# Patient Record
Sex: Male | Born: 1935 | Race: White | Hispanic: No | Marital: Married | Smoking: Former smoker
Health system: Southern US, Community
[De-identification: ages and names within clinical notes are randomized; demographics above are authoritative.]

## PROBLEM LIST (undated history)

## (undated) DIAGNOSIS — I10 Essential (primary) hypertension: Secondary | ICD-10-CM

## (undated) DIAGNOSIS — G709 Myoneural disorder, unspecified: Secondary | ICD-10-CM

## (undated) DIAGNOSIS — E7800 Pure hypercholesterolemia, unspecified: Secondary | ICD-10-CM

## (undated) DIAGNOSIS — H9190 Unspecified hearing loss, unspecified ear: Secondary | ICD-10-CM

## (undated) DIAGNOSIS — E119 Type 2 diabetes mellitus without complications: Secondary | ICD-10-CM

## (undated) DIAGNOSIS — C801 Malignant (primary) neoplasm, unspecified: Secondary | ICD-10-CM

## (undated) DIAGNOSIS — J189 Pneumonia, unspecified organism: Secondary | ICD-10-CM

## (undated) DIAGNOSIS — E860 Dehydration: Secondary | ICD-10-CM

## (undated) DIAGNOSIS — M1990 Unspecified osteoarthritis, unspecified site: Secondary | ICD-10-CM

## (undated) HISTORY — PX: COLONOSCOPY: SHX174

## (undated) HISTORY — DX: Essential (primary) hypertension: I10

## (undated) HISTORY — DX: Type 2 diabetes mellitus without complications: E11.9

---

## 1996-08-24 DIAGNOSIS — G250 Essential tremor: Secondary | ICD-10-CM | POA: Insufficient documentation

## 2000-04-22 DIAGNOSIS — K5730 Diverticulosis of large intestine without perforation or abscess without bleeding: Secondary | ICD-10-CM | POA: Insufficient documentation

## 2007-05-04 DIAGNOSIS — D126 Benign neoplasm of colon, unspecified: Secondary | ICD-10-CM | POA: Insufficient documentation

## 2008-02-18 DIAGNOSIS — M8580 Other specified disorders of bone density and structure, unspecified site: Secondary | ICD-10-CM | POA: Insufficient documentation

## 2009-10-17 DIAGNOSIS — K760 Fatty (change of) liver, not elsewhere classified: Secondary | ICD-10-CM | POA: Insufficient documentation

## 2010-10-23 DIAGNOSIS — E119 Type 2 diabetes mellitus without complications: Secondary | ICD-10-CM | POA: Insufficient documentation

## 2011-01-24 DIAGNOSIS — L570 Actinic keratosis: Secondary | ICD-10-CM | POA: Insufficient documentation

## 2011-07-19 DIAGNOSIS — H40059 Ocular hypertension, unspecified eye: Secondary | ICD-10-CM | POA: Insufficient documentation

## 2011-07-19 DIAGNOSIS — H25049 Posterior subcapsular polar age-related cataract, unspecified eye: Secondary | ICD-10-CM | POA: Insufficient documentation

## 2011-07-19 DIAGNOSIS — H43819 Vitreous degeneration, unspecified eye: Secondary | ICD-10-CM | POA: Insufficient documentation

## 2012-09-12 ENCOUNTER — Emergency Department: Payer: Self-pay | Admitting: Emergency Medicine

## 2012-09-13 ENCOUNTER — Other Ambulatory Visit: Payer: Self-pay

## 2012-09-13 LAB — SEDIMENTATION RATE: Erythrocyte Sed Rate: 1 mm/hr (ref 0–20)

## 2012-09-14 ENCOUNTER — Other Ambulatory Visit: Payer: Self-pay | Admitting: Ophthalmology

## 2012-09-15 ENCOUNTER — Ambulatory Visit: Payer: Self-pay | Admitting: Ophthalmology

## 2013-01-26 DIAGNOSIS — Z85828 Personal history of other malignant neoplasm of skin: Secondary | ICD-10-CM | POA: Insufficient documentation

## 2013-03-03 DIAGNOSIS — C44310 Basal cell carcinoma of skin of unspecified parts of face: Secondary | ICD-10-CM | POA: Insufficient documentation

## 2013-04-05 DIAGNOSIS — D751 Secondary polycythemia: Secondary | ICD-10-CM | POA: Insufficient documentation

## 2013-09-29 ENCOUNTER — Encounter: Payer: Self-pay | Admitting: Podiatry

## 2013-09-29 ENCOUNTER — Ambulatory Visit (INDEPENDENT_AMBULATORY_CARE_PROVIDER_SITE_OTHER): Payer: Medicare Other | Admitting: Podiatry

## 2013-09-29 VITALS — BP 118/62 | HR 79 | Resp 14 | Ht 72.0 in | Wt 206.0 lb

## 2013-09-29 DIAGNOSIS — M79609 Pain in unspecified limb: Secondary | ICD-10-CM

## 2013-09-29 DIAGNOSIS — B351 Tinea unguium: Secondary | ICD-10-CM

## 2013-09-29 NOTE — Progress Notes (Signed)
Magnum presents today with a chief complaint of painful toenails bilateral.  Objective: Vital signs are stable he is alert and oriented x3. Pulses are palpable bilateral. Nails are thick yellow dystrophic clinically mycotic and painful he ingrown.  Assessment: Pain in limb secondary to onychomycosis and ingrown nails.  Plan: Debridement of nails in thickness and length as a covered service secondary to pain. We'll followup with him in 3 months.

## 2013-12-01 ENCOUNTER — Inpatient Hospital Stay: Payer: Self-pay | Admitting: Internal Medicine

## 2013-12-01 LAB — URINALYSIS, COMPLETE
Bacteria: NONE SEEN
Bilirubin,UR: NEGATIVE
Blood: NEGATIVE
Glucose,UR: 150 mg/dL (ref 0–75)
Hyaline Cast: 4
Leukocyte Esterase: NEGATIVE
Nitrite: NEGATIVE
Ph: 5 (ref 4.5–8.0)
Protein: 30
RBC,UR: 1 /HPF (ref 0–5)
Specific Gravity: 1.019 (ref 1.003–1.030)
Squamous Epithelial: NONE SEEN
Transitional Epi: 1
WBC UR: 2 /HPF (ref 0–5)

## 2013-12-01 LAB — CBC
HCT: 47.8 % (ref 40.0–52.0)
HGB: 16.3 g/dL (ref 13.0–18.0)
MCH: 31.1 pg (ref 26.0–34.0)
MCHC: 34.1 g/dL (ref 32.0–36.0)
MCV: 91 fL (ref 80–100)
Platelet: 156 10*3/uL (ref 150–440)
RBC: 5.24 10*6/uL (ref 4.40–5.90)
RDW: 13.7 % (ref 11.5–14.5)
WBC: 5.8 10*3/uL (ref 3.8–10.6)

## 2013-12-01 LAB — COMPREHENSIVE METABOLIC PANEL
Albumin: 3.5 g/dL (ref 3.4–5.0)
Alkaline Phosphatase: 66 U/L
Anion Gap: 11 (ref 7–16)
BUN: 18 mg/dL (ref 7–18)
Bilirubin,Total: 1 mg/dL (ref 0.2–1.0)
Calcium, Total: 8.4 mg/dL — ABNORMAL LOW (ref 8.5–10.1)
Chloride: 98 mmol/L (ref 98–107)
Co2: 15 mmol/L — ABNORMAL LOW (ref 21–32)
Creatinine: 0.92 mg/dL (ref 0.60–1.30)
EGFR (African American): >60
EGFR (Non-African Amer.): >60
Glucose: 216 mg/dL — ABNORMAL HIGH (ref 65–99)
Osmolality: 258 (ref 275–301)
Potassium: 4.9 mmol/L (ref 3.5–5.1)
SGOT(AST): 163 U/L — ABNORMAL HIGH (ref 15–37)
SGPT (ALT): 136 U/L — ABNORMAL HIGH (ref 12–78)
Sodium: 124 mmol/L — ABNORMAL LOW (ref 136–145)
Total Protein: 8.2 g/dL (ref 6.4–8.2)

## 2013-12-01 LAB — PRO B NATRIURETIC PEPTIDE: B-Type Natriuretic Peptide: 182 pg/mL (ref 0–450)

## 2013-12-01 LAB — RAPID INFLUENZA A&B ANTIGENS

## 2013-12-01 LAB — TROPONIN I: Troponin-I: <0.02 ng/mL

## 2013-12-02 LAB — COMPREHENSIVE METABOLIC PANEL
Albumin: 2.9 g/dL — ABNORMAL LOW (ref 3.4–5.0)
Alkaline Phosphatase: 54 U/L
Anion Gap: 6 — ABNORMAL LOW (ref 7–16)
BUN: 13 mg/dL (ref 7–18)
Bilirubin,Total: 0.5 mg/dL (ref 0.2–1.0)
Calcium, Total: 7.9 mg/dL — ABNORMAL LOW (ref 8.5–10.1)
Chloride: 104 mmol/L (ref 98–107)
Co2: 24 mmol/L (ref 21–32)
Creatinine: 1.13 mg/dL (ref 0.60–1.30)
EGFR (African American): >60
EGFR (Non-African Amer.): >60
Glucose: 154 mg/dL — ABNORMAL HIGH (ref 65–99)
Osmolality: 271 (ref 275–301)
Potassium: 3.5 mmol/L (ref 3.5–5.1)
SGOT(AST): 124 U/L — ABNORMAL HIGH (ref 15–37)
SGPT (ALT): 112 U/L — ABNORMAL HIGH (ref 12–78)
Sodium: 134 mmol/L — ABNORMAL LOW (ref 136–145)
Total Protein: 6.9 g/dL (ref 6.4–8.2)

## 2013-12-02 LAB — CBC WITH DIFFERENTIAL/PLATELET
Basophil #: 0 10*3/uL (ref 0.0–0.1)
Basophil %: 1.1 %
Eosinophil #: 0 10*3/uL (ref 0.0–0.7)
Eosinophil %: 0.9 %
HCT: 42 % (ref 40.0–52.0)
HGB: 14.5 g/dL (ref 13.0–18.0)
Lymphocyte #: 1.7 10*3/uL (ref 1.0–3.6)
Lymphocyte %: 38.2 %
MCH: 31.2 pg (ref 26.0–34.0)
MCHC: 34.5 g/dL (ref 32.0–36.0)
MCV: 91 fL (ref 80–100)
Monocyte #: 0.8 x10 3/mm (ref 0.2–1.0)
Monocyte %: 17.6 %
Neutrophil #: 1.9 10*3/uL (ref 1.4–6.5)
Neutrophil %: 42.2 %
Platelet: 166 10*3/uL (ref 150–440)
RBC: 4.64 10*6/uL (ref 4.40–5.90)
RDW: 13.8 % (ref 11.5–14.5)
WBC: 4.5 10*3/uL (ref 3.8–10.6)

## 2013-12-02 LAB — CLOSTRIDIUM DIFFICILE(ARMC)

## 2013-12-05 LAB — STOOL CULTURE

## 2013-12-06 LAB — CULTURE, BLOOD (SINGLE)

## 2013-12-29 ENCOUNTER — Ambulatory Visit (INDEPENDENT_AMBULATORY_CARE_PROVIDER_SITE_OTHER): Payer: Medicare Other | Admitting: Podiatry

## 2013-12-29 ENCOUNTER — Encounter: Payer: Self-pay | Admitting: Podiatry

## 2013-12-29 VITALS — BP 99/51 | HR 79 | Resp 16 | Ht 71.0 in | Wt 204.0 lb

## 2013-12-29 DIAGNOSIS — B351 Tinea unguium: Secondary | ICD-10-CM

## 2013-12-29 DIAGNOSIS — M79609 Pain in unspecified limb: Secondary | ICD-10-CM

## 2013-12-29 NOTE — Progress Notes (Signed)
Mathew Wright presents today with a chief complaint of painful toenails one through 5 bilateral.  Objective: Vital signs are stable he is alert and oriented x3 pulses remain palpable bilateral. No open lesions. Nails are thick yellow dystrophic onychomycotic and painful palpation.  Assessment: Pain in limb secondary to onychomycosis 1 through 5 bilateral.  Plan: Debridement of nails 1 through 5 bilateral is cover service secondary to pain.

## 2014-02-04 IMAGING — US US CAROTID DUPLEX BILAT
1 series · 14 of 24 positions shown · non-contrast
Comparison: none

REASON FOR EXAM: amaurosis fugax
COMMENTS:

PROCEDURE:     PERI - PERI CAROTID DOPPLER BILATERAL  - September 15, 2012  [DATE]
RESULT:

[Series 1: us carotid duplex bilat · 0.07mm/px · 14 of 58 slices shown]
[im 1/58]
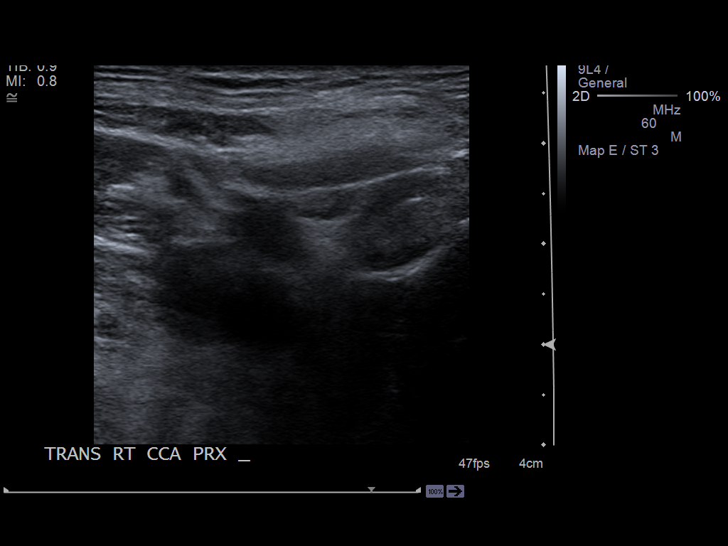
[im 5/58]
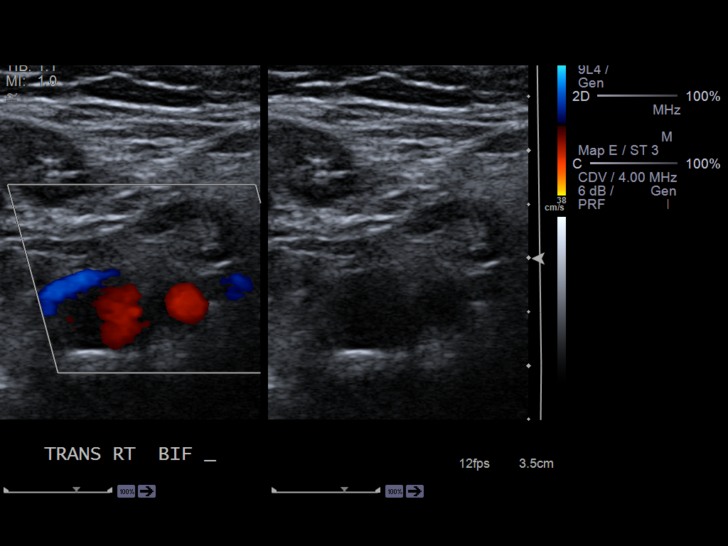
[im 10/58]
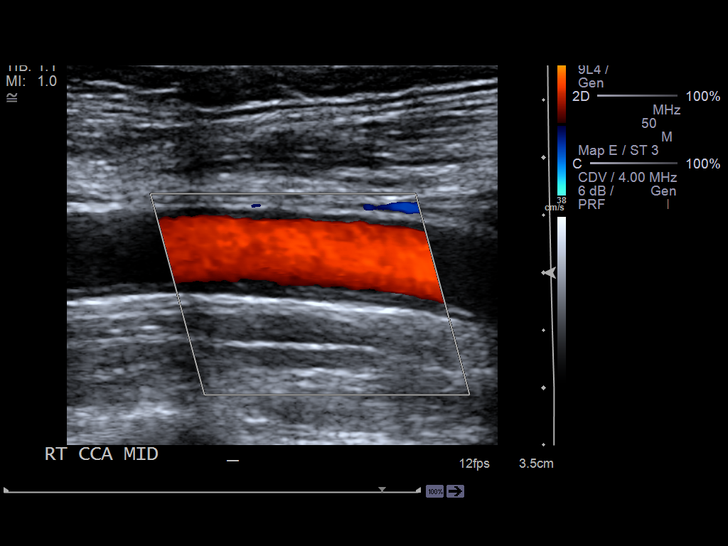
[im 15/58]
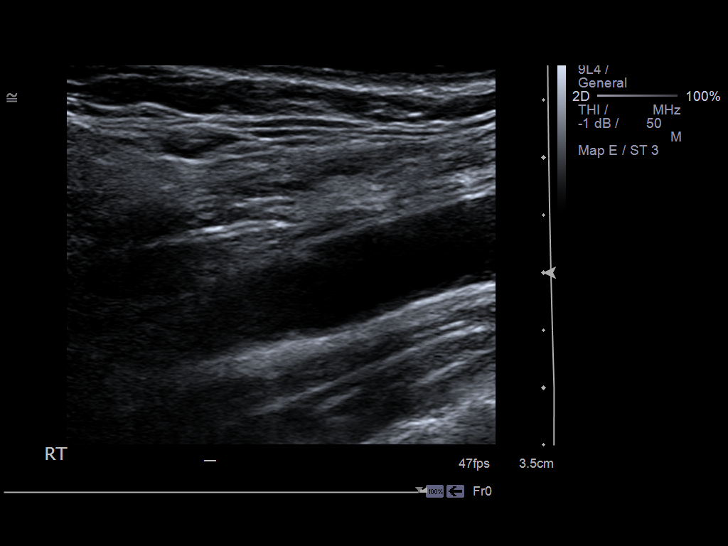
[im 18/58]
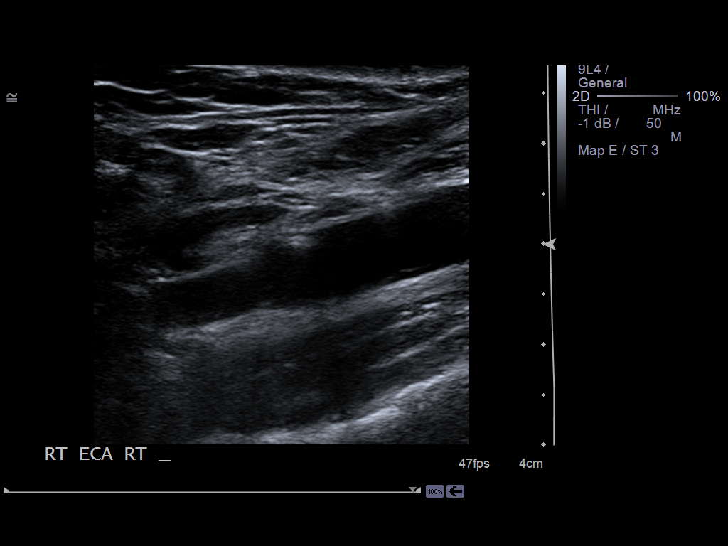
[im 23/58]
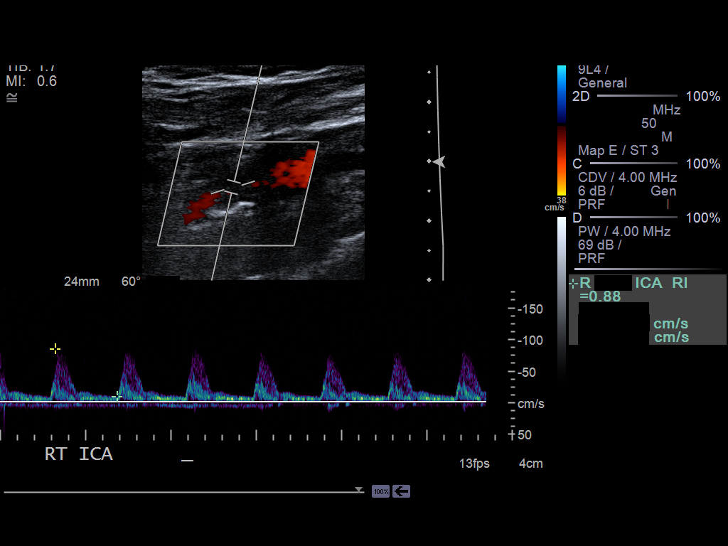
[im 28/58]
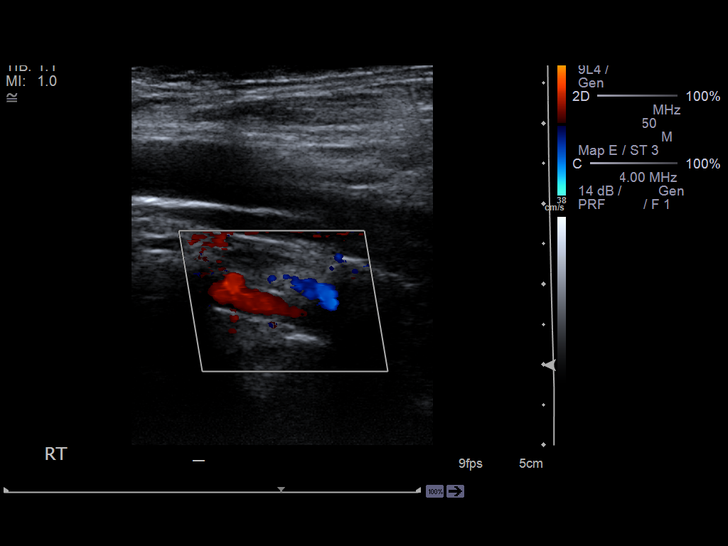
[im 30/58]
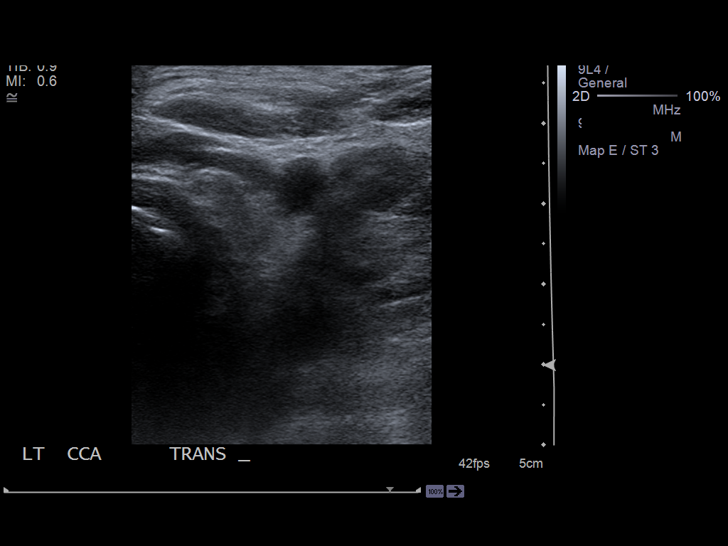
[im 35/58]
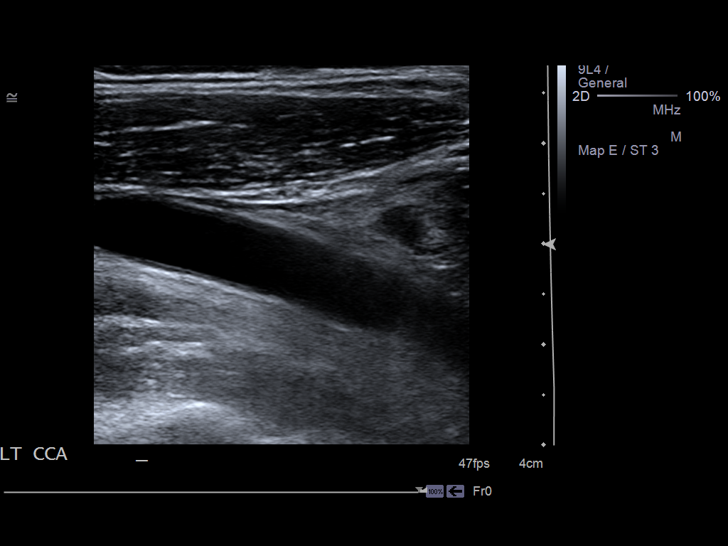
[im 40/58]
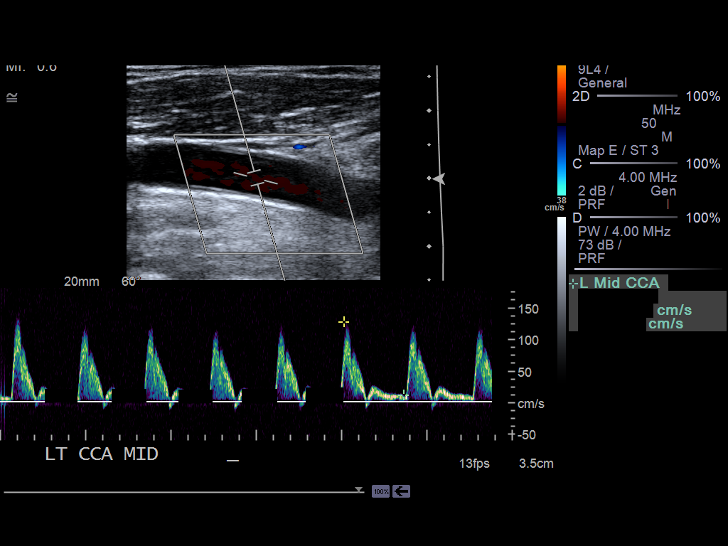
[im 45/58]
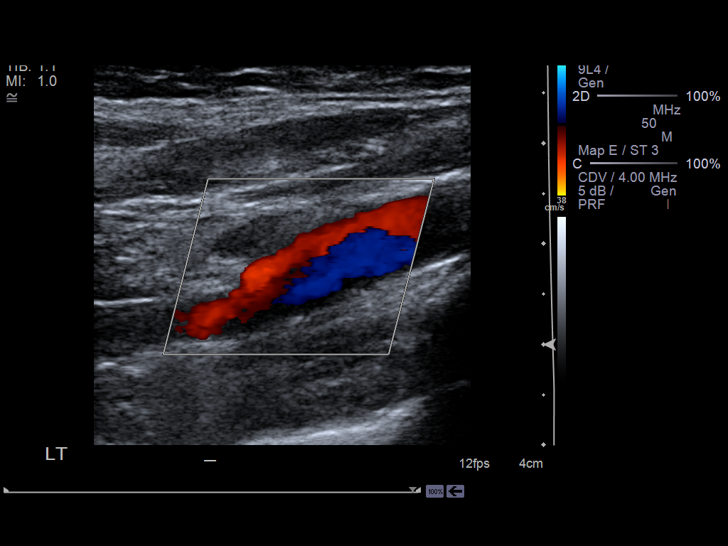
[im 48/58]
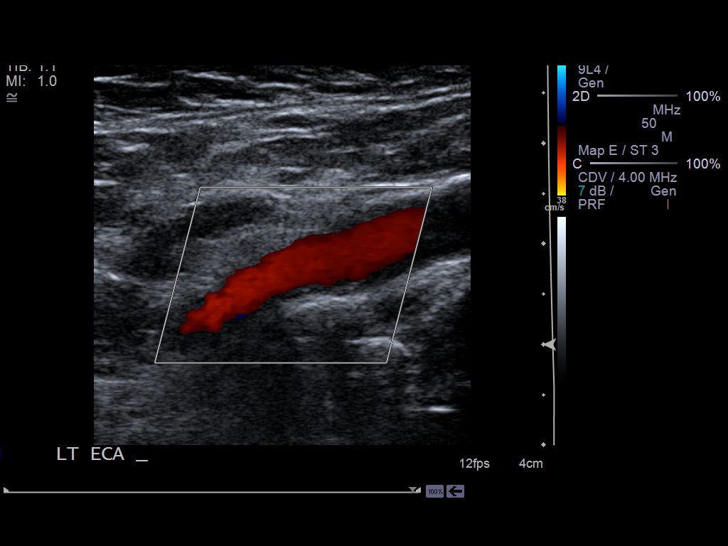
[im 53/58]
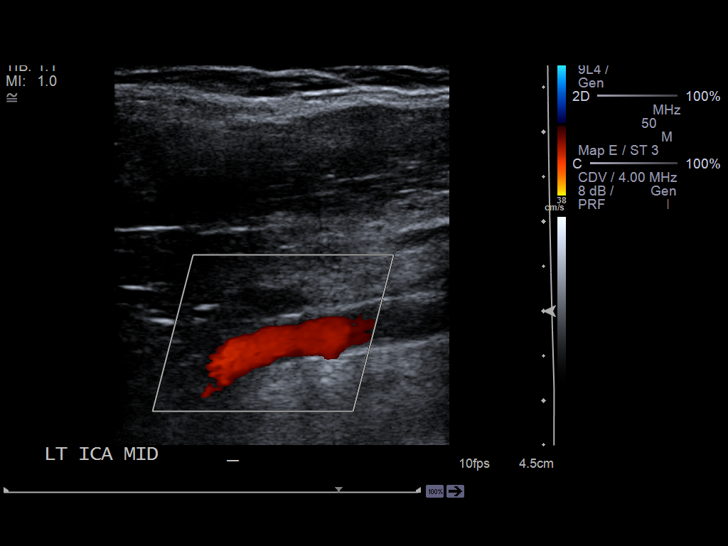
[im 58/58]
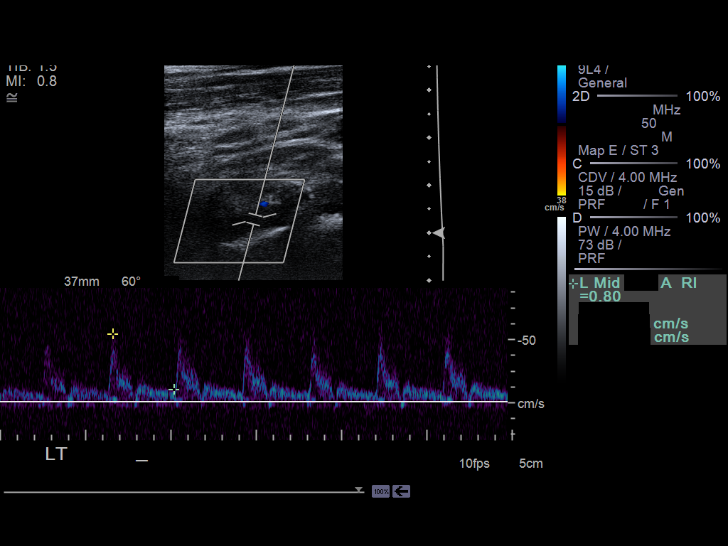

[14 of 24 positions shown; findings below may reference images not displayed]

FINDINGS: Grayscale, duplex Doppler, and spectral waveform imaging was
informed of the right and left carotid systems.
FINDINGS: Visual evaluation of the right carotid system demonstrates
asymmetric smoothly calcified plaque within the internal carotid artery
demonstrating less than 50% stenosis. No further evidence of appreciable
plaque identified within the right or left carotid systems.

Grayscale, color flow and spectral waveform imaging is unremarkable within
the right and left carotid systems.

ICA/CCA ratios:

RIGHT:

LEFT:

Antegrade flow is identified within the right and left vertebral arteries.
IMPRESSION: No sonographic evidence of hemodynamically significant
stenosis within the right or left carotid systems.

## 2014-03-30 ENCOUNTER — Encounter: Payer: Self-pay | Admitting: Podiatry

## 2014-03-30 ENCOUNTER — Ambulatory Visit (INDEPENDENT_AMBULATORY_CARE_PROVIDER_SITE_OTHER): Payer: Medicare Other | Admitting: Podiatry

## 2014-03-30 VITALS — BP 122/68 | HR 62 | Resp 18

## 2014-03-30 DIAGNOSIS — B351 Tinea unguium: Secondary | ICD-10-CM

## 2014-03-30 DIAGNOSIS — M79609 Pain in unspecified limb: Secondary | ICD-10-CM

## 2014-03-30 NOTE — Progress Notes (Signed)
He presents today chief complaint of painful elongated toenails one through 5 bilateral.  Objective: Nails are thick yellow dystrophic with mycotic and painful palpation 1 through 5 bilateral.  Assessment: Pain in limb secondary to onychomycosis.  Plan: Debridement nails 1 through 5 bilateral covered service secondary to pain.

## 2014-06-29 ENCOUNTER — Ambulatory Visit (INDEPENDENT_AMBULATORY_CARE_PROVIDER_SITE_OTHER): Payer: Medicare Other | Admitting: Podiatry

## 2014-06-29 DIAGNOSIS — B351 Tinea unguium: Secondary | ICD-10-CM

## 2014-06-29 DIAGNOSIS — M79676 Pain in unspecified toe(s): Secondary | ICD-10-CM

## 2014-06-29 DIAGNOSIS — M79609 Pain in unspecified limb: Secondary | ICD-10-CM

## 2014-06-29 NOTE — Progress Notes (Signed)
He presents today with a chief complaint of painful elongated toenails.  Objective: Nails are thick yellow dystrophic with mycotic and painful palpation.  Assessment: Pain in limb secondary to onychomycosis 1 through 5 bilateral.  Plan: Debridement of nails 1 through 5 bilateral covered service secondary to pain. 

## 2014-10-05 ENCOUNTER — Ambulatory Visit (INDEPENDENT_AMBULATORY_CARE_PROVIDER_SITE_OTHER): Payer: Medicare Other | Admitting: Podiatry

## 2014-10-05 DIAGNOSIS — M79676 Pain in unspecified toe(s): Secondary | ICD-10-CM

## 2014-10-05 DIAGNOSIS — B351 Tinea unguium: Secondary | ICD-10-CM

## 2014-10-05 NOTE — Progress Notes (Signed)
Presents today chief complaint of painful elongated toenails.  Objective: Pulses are palpable bilateral nails are thick, yellow dystrophic onychomycosis and painful palpation.   Assessment: Onychomycosis with pain in limb.  Plan: Treatment of nails in thickness and length as covered service secondary to pain.  

## 2015-01-09 ENCOUNTER — Ambulatory Visit: Payer: Medicare Other | Admitting: Podiatry

## 2015-01-09 ENCOUNTER — Ambulatory Visit: Payer: Self-pay

## 2015-01-12 ENCOUNTER — Ambulatory Visit (INDEPENDENT_AMBULATORY_CARE_PROVIDER_SITE_OTHER): Payer: Medicare Other | Admitting: Podiatry

## 2015-01-12 DIAGNOSIS — B351 Tinea unguium: Secondary | ICD-10-CM

## 2015-01-12 DIAGNOSIS — E1140 Type 2 diabetes mellitus with diabetic neuropathy, unspecified: Secondary | ICD-10-CM

## 2015-01-12 DIAGNOSIS — E1149 Type 2 diabetes mellitus with other diabetic neurological complication: Secondary | ICD-10-CM

## 2015-01-12 DIAGNOSIS — M79676 Pain in unspecified toe(s): Secondary | ICD-10-CM

## 2015-01-12 NOTE — Patient Instructions (Signed)
Diabetes and Foot Care Diabetes may cause you to have problems because of poor blood supply (circulation) to your feet and legs. This may cause the skin on your feet to become thinner, break easier, and heal more slowly. Your skin may become dry, and the skin may peel and crack. You may also have nerve damage in your legs and feet causing decreased feeling in them. You may not notice minor injuries to your feet that could lead to infections or more serious problems. Taking care of your feet is one of the most important things you can do for yourself.  HOME CARE INSTRUCTIONS  Wear shoes at all times, even in the house. Do not go barefoot. Bare feet are easily injured.  Check your feet daily for blisters, cuts, and redness. If you cannot see the bottom of your feet, use a mirror or ask someone for help.  Wash your feet with warm water (do not use hot water) and mild soap. Then pat your feet and the areas between your toes until they are completely dry. Do not soak your feet as this can dry your skin.  Apply a moisturizing lotion or petroleum jelly (that does not contain alcohol and is unscented) to the skin on your feet and to dry, brittle toenails. Do not apply lotion between your toes.  Trim your toenails straight across. Do not dig under them or around the cuticle. File the edges of your nails with an emery board or nail file.  Do not cut corns or calluses or try to remove them with medicine.  Wear clean socks or stockings every day. Make sure they are not too tight. Do not wear knee-high stockings since they may decrease blood flow to your legs.  Wear shoes that fit properly and have enough cushioning. To break in new shoes, wear them for just a few hours a day. This prevents you from injuring your feet. Always look in your shoes before you put them on to be sure there are no objects inside.  Do not cross your legs. This may decrease the blood flow to your feet.  If you find a minor scrape,  cut, or break in the skin on your feet, keep it and the skin around it clean and dry. These areas may be cleansed with mild soap and water. Do not cleanse the area with peroxide, alcohol, or iodine.  When you remove an adhesive bandage, be sure not to damage the skin around it.  If you have a wound, look at it several times a day to make sure it is healing.  Do not use heating pads or hot water bottles. They may burn your skin. If you have lost feeling in your feet or legs, you may not know it is happening until it is too late.  Make sure your health care provider performs a complete foot exam at least annually or more often if you have foot problems. Report any cuts, sores, or bruises to your health care provider immediately. SEEK MEDICAL CARE IF:   You have an injury that is not healing.  You have cuts or breaks in the skin.  You have an ingrown nail.  You notice redness on your legs or feet.  You feel burning or tingling in your legs or feet.  You have pain or cramps in your legs and feet.  Your legs or feet are numb.  Your feet always feel cold. SEEK IMMEDIATE MEDICAL CARE IF:   There is increasing redness,   swelling, or pain in or around a wound.  There is a red line that goes up your leg.  Pus is coming from a wound.  You develop a fever or as directed by your health care provider.  You notice a bad smell coming from an ulcer or wound. Document Released: 11/01/2000 Document Revised: 07/07/2013 Document Reviewed: 04/13/2013 Exit[employer] Patient Information 2015 [employer]. This information is not intended to replace advice given to you by your health care provider. Make sure you discuss any questions you have with your health care provider.  

## 2015-01-12 NOTE — Progress Notes (Signed)
Subjective: 80 y.o.-year-old male returns the office today for elongated, thickened toenails which he is unable to trim himself. He does state the nails rub in the shoes as they are elongated causing pressure.  Denies any redness or drainage around the nails. Denies any acute changes since last appointment and no new complaints today. Denies any systemic complaints such as fevers, chills, nausea, vomiting.   Objective: AAO 3, NAD DP/PT pulses palpable, CRT less than 3 seconds Protective sensation absent with Derrel Nip monofilament, Achilles tendon reflex intact.  Nails hypertrophic, dystrophic, elongated, brittle, discolored 10. There is tenderness overlying these nails bilaterally. There is no surrounding erythema or drainage along the nail sites. No open lesions or pre-ulcerative lesions are identified. No interdigital maceration.  No other areas of tenderness bilateral lower extremities. No overlying edema, erythema, increased warmth. No pain with calf compression, swelling, warmth, erythema.  Assessment: Patient presents with symptomatic onychomycosis, neuropathy  Plan: -Treatment options including alternatives, risks, complications were discussed -Nails sharply debrided 10 without complication/bleeding. -Discussed daily foot inspection. If there are any changes, to call the office immediately.  -Follow-up in 3 months or sooner if any problems are to arise. In the meantime, encouraged to call the office with any questions, concerns, changes symptoms.

## 2015-03-11 NOTE — H&P (Signed)
PATIENT NAME:  Mathew Wright, Mathew Wright MR#:  [id number] DATE OF BIRTH:  [birth year]  DATE OF ADMISSION:  12/01/2013  PRIMARY CARE PHYSICIAN: [facility] faculty physician Dr. Thomes Lolling.    REQUESTING PHYSICIAN:  Dr. Lenise Arena   CHIEF COMPLAINT: Diarrhea and flulike illness.   HISTORY OF PRESENT ILLNESS: The patient is a 80 year old male with a known history of hypertension, diabetes, Parkinson's disease. He is being admitted for flu-like illness and severe hyponatremia. The patient has been suffering from flu-like illness since after Christmas, had significant cough and went to walk-in clinic, who prescribed him amoxicillin for five days. He kept getting worse and called his PCP, who changed antibiotic to Z-Pak, but that was even worse according to him.  He continued to have a continuous hacking cough with grayish mucus at times, associated with shortness of breath which he claims is because he cannot breathe through his nose.  He started having severe diarrhea yesterday with loose watery stool. No blood in it. Denies any fever. He was not able to keep much for the last three days and feeling significantly weak. He also reports having somewhat black stools starting yesterday. He was feeling so bad he decided to come to the Emergency Department. While in the ED, he was found to have positive influenza A. His heart rate was 117, and he is being admitted for further evaluation and management.   PAST MEDICAL HISTORY: 1.  Hypertension.  2.  Benign prostatic hypertrophy.  3.  Diabetes.  4.  Possible Parkinson's disease, as he is on primidone.   ALLERGIES: No known drug allergies.   MEDICATIONS AT HOME:   1.  Amlodipine 5 mg p.o. daily.  2.  Bayer aspirin 81 mg p.o. daily.  3.  Centrum Silver once daily.  4.  Dorzolamide/timolol eyedrop one drop each eye twice a day.  5.  Enalapril 10 mg p.o. b.i.d.  6.  Finasteride 5 mg p.o. daily.  7.  Fish Oil 1000 mg p.o. daily.  8.  Metformin 500 mg p.o. b.i.d.   9.  Metoprolol 100 mg p.o. daily.  10.  Primidone 50 mg 2 tablets p.o. every morning and 2 tablets every evening.  11.  Propranolol 10 mg p.o. b.i.d.  12.  Simvastatin 20 mg p.o. at bedtime.  13.  Tamsulosin 0.4 mg p.o. daily.   SOCIAL HISTORY: No smoking. Occasional alcohol, no IV drugs of abuse. 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   FAMILY HISTORY: Mother had myocardial infarction father had stomach aneurysm.   REVIEW OF SYSTEMS: CONSTITUTIONAL: No fever. Positive fatigue and weakness.  EYES: No blurred or double vision.  ENT: No tinnitus, ear pain.  RESPIRATORY: Positive for cough with gray sputum. Also having some shortness of breath due to nose congestion.  CARDIOVASCULAR: No chest pain, orthopnea, edema.  GASTROINTESTINAL: Positive for diarrhea. No nausea, vomiting and poor p.o. intake.  GENITOURINARY: No dysuria or hematuria.  ENDOCRINE: No polyuria or nocturia.  HEMATOLOGY: No anemia or easy bruising.  SKIN: No rash or lesion.  MUSCULOSKELETAL: He does have arthralgia, especially hip and back pain, but all of the joints have been achy.   NEUROLOGIC: No tingling, numbness. Positive for weakness.  PSYCHIATRY: No history of anxiety or depression.   SKIN:  No obvious rash, lesion or ulcer.   PHYSICAL EXAMINATION: VITAL SIGNS: Temperature 98.2, heart rate 117 per minute, respiration 22 per minute, blood pressure 121/76 mmHg, saturating 98% on room air.  GENERAL: The  patient is a 80 year old male lying in bed in some acute distress.  EYES: Pupils equal, round and reactive to light and accommodation. No scleral icterus.  Extraocular muscles are intact.  HENT: Head atraumatic, normocephalic. Oropharynx and nasopharynx are clear.  NECK: Supple. No jugular venous distention. No thyroid enlargement or tenderness.  LUNGS: Decreased breath sound at the bases bilaterally. Rhonchi throughout both lungs. No wheezing, rales, or crepitation.   CARDIOVASCULAR: S1, S2 normal. No murmurs, rubs or gallop.  ABDOMEN: Soft, nontender, nondistended. Bowel sounds present. No organomegaly or mass.  EXTREMITIES: No pedal edema, cyanosis or clubbing.  NEUROLOGIC: Nonfocal examination. Cranial nerves II through XII intact. Muscle strength 4/5 in all extremities. Sensation intact.  PSYCHIATRIC: The patient is alert and oriented x 3. He seemed somewhat anxious now as he cannot breathe through his nose.  SKIN: No obvious rash, lesion, ulcer.  MUSCULOSKELETAL: No joint effusion or tenderness.   LABORATORY DATA: Normal BMP except sodium 124, CO2 of 15. Normal CBC. Normal first set of troponin. Influenza test A was positive. Urinalysis was negative. LFTs showed elevated AST of 163 and ALT 136.   EKG shows sinus tachycardia with occasional PVCs. No major ST-T changes.   Chest x-ray in the ED showed cardiomegaly. No major acute cardiopulmonary disease.   IMPRESSION AND PLAN: 1.  Influenza A positive. We will start him on Tamiflu and provide isolation precautions.  2.  Severe hyponatremia, likely due to dehydration from diet. We will start him on IV hydration with fluids.  3.  Diarrhea.  This could be due to antibiotic; taking amoxicillin and subsequently Zithromax. We will stop dose and check stool for Clostridium difficile and comprehensive culture.  4. Generalized weakness, likely due to poor oral intake with ongoing illness. We will get physical therapy evaluation and management.  5.  Elevated liver function tests. Unknown baseline. We will repeat in the morning. This certainly could be from ongoing viral illness. We will recheck in the morning.  6.  Hypertension. We will continue his home medications.   CODE STATUS: Full code.   TOTAL TIME TAKING CARE OF THIS PATIENT: 55 minutes.     ____________________________ Lucina Mellow. Manuella Ghazi, MD vss:cc D: 12/01/2013 17:29:59 ET T: 12/01/2013 18:21:58 ET JOB#: [id number]  cc: Marchetta Navratil S. Manuella Ghazi, MD,  <Dictator> Thomes Lolling, MD [place] MD ELECTRONICALLY SIGNED 12/08/2013 14:43

## 2015-03-11 NOTE — Discharge Summary (Signed)
PATIENT NAME:  Mathew Wright, SENFT MR#:  [id number] DATE OF BIRTH:  September 30, 1935  DATE OF ADMISSION:  12/01/2013 DATE OF DISCHARGE:  12/03/2013  DISCHARGE DIAGNOSES: 1.  Influenza A.  2.  Generalized weakness. 3.  Hypertension.  4.  Diarrhea.   CONDITION ON DISCHARGE: Stable.   CODE STATUS: FULL CODE.   MEDICATIONS ON DISCHARGE:  1.  Enalapril 10 mg oral tablet 2 times a day.  2.  Metoprolol succinate 100 mg oral tablet once a day.  3.  Amlodipine 5 mg once a day.  4.  Simvastatin 20 mg once a day.  5.  Primidone 50 mg oral tablet 2 tablets once a day.  6.  Propranolol 10 mg oral tablet 2 times a day.  7.  Centrum Silver 1 tablet once a day.  8.  Fish oil 1000 mg oral capsule once a day.  9.  81 mg aspirin once a day.  10.  Metformin 500 mg oral tablet 2 times a day.  11.  Finasteride 5 mg once a day.  12.  Tamsulosin 0.4 mg oral capsule once a day.  13.  Acetaminophen 325 mg 2 tablets every 4 hours as needed for pain or temperature.  14.  Oseltamivir 75 mg oral capsule every 12 hours for 2 days.   DIET ON DISCHARGE: Low-sodium, carbohydrate-controlled ADA diet. Diet consistency: Regular.   ACTIVITY: As tolerated.   TIMEFRAME TO FOLLOW-UP: Within 2 to 4 weeks, routine follow-up with primary care physician.   HISTORY OF PRESENT ILLNESS: A 80 year old male with known history of hypertension, diabetes, Parkinson disease, was admitted for flu-like symptoms, severe hyponatremia, had been suffering from flu-like illness since after Christmas with significant cough. Went to walk in clinic who prescribed him amoxicillin for 5 days. He kept getting worse so called PMD to change it to Z-Pak which was even worse according to him and so continued to have hacking cough, grayish mucus, associated shortness of breath and was not able to breathe through his nose at all.  He started having some severe diarrhea on the previous day and loose watery stool. No blood in it. Denied any fever. He was not able  to eat much for the last three days and was feeling significantly weak generalized, so decided to come to the Emergency Room. He was found having heart rate 117. Positive for influenza A.   HOSPITAL COURSE AND STAY:  1.  Influenza A. The patient was started on Tamiflu and continued with that, kept on respiratory isolation precaution for 2 days in the hospital and on discharge was given oral Tamiflu to finish the course.   2.  Severe hyponatremia. Likely this was due to dehydration from diet. We gave IV hydration with fluid and it improved.  3.  Diarrhea. This was due to antibiotic taking, amoxicillin and subsequently Zithromax.  The diarrhea stopped.  May be it was viral or antibiotic-induced. C. difficile was negative, we checked in the hospital.  4.  Generalized weakness. This was due to poor oral intake and ongoing illness for a long time. PT's evaluation was done and they suggested home health.  5.  Elevated liver function test, unknown baseline. Most likely it was due to ongoing viral illness and on further follow-up it was improving.  6.  Hypertension. We continued home medication and remained stable.   IMPORTANT LABORATORY RESULTS IN THE HOSPITAL: Troponin less than 0.02. BNP was 182. WBC count 5.8 on admission. Hemoglobin 16.3 and platelet count 156, glucose level 216, creatinine  0.92, sodium 124 and potassium 4.9. Urinalysis is grossly negative. Blood culture: There was no growth. Influenza A and B.  A was positive, B was negative. Chest x-ray, PA:  Cardiomegaly without decompensation, basilar atelectasis. Stool for C-diff was negative and stool culture was negative. Repeat chest x-ray: No acute cardiopulmonary abnormality.   TOTAL TIME SPENT ON THIS PATIENT'S DISCHARGE: 40 minutes.  ____________________________ Ceasar Lund Anselm Jungling, MD [id number] D: 12/07/2013 14:43:09 ET T: 12/07/2013 15:49:16 ET JOB#: [id number]  cc: Ceasar Lund. Anselm Jungling, MD, <Dictator> Vaughan Basta  MD ELECTRONICALLY SIGNED 12/10/2013 18:44

## 2015-04-13 ENCOUNTER — Ambulatory Visit (INDEPENDENT_AMBULATORY_CARE_PROVIDER_SITE_OTHER): Payer: Medicare Other | Admitting: Podiatry

## 2015-04-13 DIAGNOSIS — B351 Tinea unguium: Secondary | ICD-10-CM | POA: Diagnosis not present

## 2015-04-13 DIAGNOSIS — E1149 Type 2 diabetes mellitus with other diabetic neurological complication: Secondary | ICD-10-CM

## 2015-04-13 DIAGNOSIS — E1140 Type 2 diabetes mellitus with diabetic neuropathy, unspecified: Secondary | ICD-10-CM

## 2015-04-13 DIAGNOSIS — M79676 Pain in unspecified toe(s): Secondary | ICD-10-CM | POA: Diagnosis not present

## 2015-04-13 NOTE — Progress Notes (Signed)
Subjective: 80 y.o.-year-old male returns the office today for elongated, thickened toenails which he is unable to trim himself. Denies any redness or drainage around the nails. Denies any acute changes since last appointment and no new complaints today. Denies any systemic complaints such as fevers, chills, nausea, vomiting.   Objective: AAO 3, NAD DP/PT pulses palpable, CRT less than 3 seconds Protective sensation absent with Derrel Nip monofilament, Achilles tendon reflex intact.  Nails hypertrophic, dystrophic, elongated, brittle, discolored 10. There is tenderness overlying these nails bilaterally. There is no surrounding erythema or drainage along the nail sites. No open lesions or pre-ulcerative lesions are identified. No interdigital maceration.  No other areas of tenderness bilateral lower extremities. No overlying edema, erythema, increased warmth. No pain with calf compression, swelling, warmth, erythema.  Assessment: Patient presents with symptomatic onychomycosis, neuropathy  Plan: -Treatment options including alternatives, risks, complications were discussed -Nails sharply debrided 10 without complication/bleeding. -Discussed daily foot inspection. If there are any changes, to call the office immediately.  -Follow-up in 3 months or sooner if any problems are to arise. In the meantime, encouraged to call the office with any questions, concerns, changes symptoms.

## 2015-07-17 ENCOUNTER — Ambulatory Visit (INDEPENDENT_AMBULATORY_CARE_PROVIDER_SITE_OTHER): Payer: Medicare Other | Admitting: Podiatry

## 2015-07-17 DIAGNOSIS — B351 Tinea unguium: Secondary | ICD-10-CM | POA: Diagnosis not present

## 2015-07-17 DIAGNOSIS — E1140 Type 2 diabetes mellitus with diabetic neuropathy, unspecified: Secondary | ICD-10-CM

## 2015-07-17 DIAGNOSIS — E1149 Type 2 diabetes mellitus with other diabetic neurological complication: Secondary | ICD-10-CM

## 2015-07-17 DIAGNOSIS — M79676 Pain in unspecified toe(s): Secondary | ICD-10-CM | POA: Diagnosis not present

## 2015-07-17 NOTE — Progress Notes (Signed)
He presents today with a chief complaint of painful elongated toenails. He also complains of sharp incurvated nail margins.  Objective: Nails are thick yellow dystrophic with mycotic and painful palpation. No margins were sharply incurvated with mild erythema no edema no cellulitis drainage or odor.  Assessment: Pain in limb secondary to onychomycosis 1 through 5 bilateral. Ingrown nails hallux bilateral.  Plan: Debridement of nails 1 through 5 bilateral covered service secondary to pain. 3 mo. debridement nails as well as sharp edges.  Roselind Messier DPM

## 2015-10-16 ENCOUNTER — Ambulatory Visit: Payer: Medicare Other

## 2015-10-26 ENCOUNTER — Ambulatory Visit (INDEPENDENT_AMBULATORY_CARE_PROVIDER_SITE_OTHER): Payer: Medicare Other | Admitting: Podiatry

## 2015-10-26 ENCOUNTER — Encounter: Payer: Self-pay | Admitting: Podiatry

## 2015-10-26 DIAGNOSIS — B351 Tinea unguium: Secondary | ICD-10-CM | POA: Diagnosis not present

## 2015-10-26 DIAGNOSIS — M79676 Pain in unspecified toe(s): Secondary | ICD-10-CM

## 2015-10-26 DIAGNOSIS — E1149 Type 2 diabetes mellitus with other diabetic neurological complication: Secondary | ICD-10-CM

## 2015-10-26 NOTE — Progress Notes (Signed)
Subjective: 80 y.o.-year-old male returns the office today for elongated, thickened toenails which he is unable to trim himself. Denies any redness or drainage around the nails. Denies any acute changes since last appointment and no new complaints today. Denies any systemic complaints such as fevers, chills, nausea, vomiting.   Objective: AAO 3, NAD DP/PT pulses palpable, CRT less than 3 seconds Protective sensation decreased with Simms Weinstein monofilament  Nails hypertrophic, dystrophic, elongated, brittle, discolored 10. There is tenderness overlying these nails 1-5 bilaterally. There is no surrounding erythema or drainage along the nail sites. No open lesions or pre-ulcerative lesions are identified. No interdigital maceration.  No other areas of tenderness bilateral lower extremities. No overlying edema, erythema, increased warmth. No pain with calf compression, swelling, warmth, erythema.  Assessment: Patient presents with symptomatic onychomycosis, neuropathy  Plan: -Treatment options including alternatives, risks, complications were discussed -Nails sharply debrided 10 without complication/bleeding. -Discussed daily foot inspection. If there are any changes, to call the office immediately.  -Follow-up in 3 months or sooner if any problems are to arise. In the meantime, encouraged to call the office with any questions, concerns, changes symptoms.  Celesta Gentile, DPM

## 2016-01-25 ENCOUNTER — Encounter: Payer: Self-pay | Admitting: Podiatry

## 2016-01-25 ENCOUNTER — Ambulatory Visit (INDEPENDENT_AMBULATORY_CARE_PROVIDER_SITE_OTHER): Payer: Medicare Other | Admitting: Podiatry

## 2016-01-25 DIAGNOSIS — M79676 Pain in unspecified toe(s): Secondary | ICD-10-CM | POA: Diagnosis not present

## 2016-01-25 DIAGNOSIS — B351 Tinea unguium: Secondary | ICD-10-CM

## 2016-01-25 DIAGNOSIS — E1149 Type 2 diabetes mellitus with other diabetic neurological complication: Secondary | ICD-10-CM | POA: Diagnosis not present

## 2016-01-25 NOTE — Progress Notes (Signed)
Patient ID: Mathew Wright, male   DOB: 10-04-1935, 81 y.o.   MRN: [id number]  Subjective: 81 y.o.-year-old male returns the office today for elongated, thickened toenails which he is unable to trim himself. Denies any redness or drainage around the nails. Denies any acute changes since last appointment and no new complaints today. Denies any systemic complaints such as fevers, chills, nausea, vomiting.   Objective: AAO 3, NAD DP/PT pulses palpable, CRT less than 3 seconds Protective sensation decreased with Simms Weinstein monofilament  Nails hypertrophic, dystrophic, elongated, brittle, discolored 10. There is tenderness overlying these nails 1-5 bilaterally. There is no surrounding erythema or drainage along the nail sites. No open lesions or pre-ulcerative lesions are identified. No interdigital maceration. Dry, xerotic skin to bilateral heels. No fissuring.  No other areas of tenderness bilateral lower extremities. No overlying edema, erythema, increased warmth. No pain with calf compression, swelling, warmth, erythema.  Assessment: Patient presents with symptomatic onychomycosis, neuropathy  Plan: -Treatment options including alternatives, risks, complications were discussed -Nails sharply debrided 10 without complication/bleeding. -Discussed daily foot inspection. If there are any changes, to call the office immediately.  -Follow-up in 3 months or sooner if any problems are to arise. In the meantime, encouraged to call the office with any questions, concerns, changes symptoms.  Celesta Gentile, DPM

## 2016-04-25 ENCOUNTER — Ambulatory Visit (INDEPENDENT_AMBULATORY_CARE_PROVIDER_SITE_OTHER): Payer: Medicare Other | Admitting: Podiatry

## 2016-04-25 ENCOUNTER — Encounter: Payer: Self-pay | Admitting: Podiatry

## 2016-04-25 DIAGNOSIS — B351 Tinea unguium: Secondary | ICD-10-CM

## 2016-04-25 DIAGNOSIS — E1149 Type 2 diabetes mellitus with other diabetic neurological complication: Secondary | ICD-10-CM | POA: Diagnosis not present

## 2016-04-25 DIAGNOSIS — M79676 Pain in unspecified toe(s): Secondary | ICD-10-CM

## 2016-04-25 NOTE — Progress Notes (Signed)
Patient ID: Mathew Wright, male   DOB: Apr 02, 1935, 81 y.o.   MRN: [id number]  Subjective: 81 y.o.-year-old male returns the office today for elongated, thickened toenails which he is unable to trim himself. Denies any redness or drainage around the nails. Denies any acute changes since last appointment and no new complaints today. Denies any systemic complaints such as fevers, chills, nausea, vomiting.   Objective: AAO 3, NAD DP/PT pulses palpable, CRT less than 3 seconds Protective sensation decreased with Simms Weinstein monofilament  Nails hypertrophic, dystrophic, elongated, brittle, discolored 10. There is tenderness overlying these nails 1-5 bilaterally. There is no surrounding erythema or drainage along the nail sites. No open lesions or pre-ulcerative lesions are identified. No interdigital maceration. Dry, xerotic skin to bilateral heels. No fissuring.  No other areas of tenderness bilateral lower extremities. No overlying edema, erythema, increased warmth. No pain with calf compression, swelling, warmth, erythema.  Assessment: Patient presents with symptomatic onychomycosis, neuropathy  Plan: -Treatment options including alternatives, risks, complications were discussed -Nails sharply debrided 10 without complication/bleeding. -Discussed daily foot inspection. If there are any changes, to call the office immediately.  -Follow-up in 3 months or sooner if any problems are to arise. In the meantime, encouraged to call the office with any questions, concerns, changes symptoms.  Celesta Gentile, DPM

## 2016-05-03 DIAGNOSIS — Z7189 Other specified counseling: Secondary | ICD-10-CM | POA: Insufficient documentation

## 2016-08-01 ENCOUNTER — Ambulatory Visit: Payer: Medicare Other | Admitting: Podiatry

## 2016-08-08 ENCOUNTER — Encounter: Payer: Self-pay | Admitting: Podiatry

## 2016-08-08 ENCOUNTER — Ambulatory Visit (INDEPENDENT_AMBULATORY_CARE_PROVIDER_SITE_OTHER): Payer: Medicare Other | Admitting: Podiatry

## 2016-08-08 DIAGNOSIS — E1149 Type 2 diabetes mellitus with other diabetic neurological complication: Secondary | ICD-10-CM | POA: Diagnosis not present

## 2016-08-08 DIAGNOSIS — M79676 Pain in unspecified toe(s): Secondary | ICD-10-CM

## 2016-08-08 DIAGNOSIS — B351 Tinea unguium: Secondary | ICD-10-CM

## 2016-08-08 NOTE — Progress Notes (Signed)
Patient ID: Mathew Wright, male   DOB: 03/06/1935, 81 y.o.   MRN: [id number]  Subjective: 81 y.o.-year-old male returns the office today for elongated, thickened toenails which he is unable to trim himself. Denies any redness or drainage around the nails. Denies any acute changes since last appointment and no new complaints today. Denies any systemic complaints such as fevers, chills, nausea, vomiting.   Objective: AAO 3, NAD DP/PT pulses palpable, CRT less than 3 seconds Protective sensation decreased with Simms Weinstein monofilament  Nails hypertrophic, dystrophic, elongated, brittle, discolored 10. There is tenderness overlying these nails 1-5 bilaterally. There is no surrounding erythema or drainage along the nail sites. No open lesions or pre-ulcerative lesions are identified. No interdigital maceration. Dry, xerotic skin to bilateral heels. No fissuring.  No other areas of tenderness bilateral lower extremities. No overlying edema, erythema, increased warmth. No pain with calf compression, swelling, warmth, erythema.  Assessment: Patient presents with symptomatic onychomycosis, neuropathy  Plan: -Treatment options including alternatives, risks, complications were discussed -Nails sharply debrided 10 without complication/bleeding. -Discussed daily foot inspection. If there are any changes, to call the office immediately.  -Follow-up in 3 months or sooner if any problems are to arise. In the meantime, encouraged to call the office with any questions, concerns, changes symptoms.  Celesta Gentile, DPM

## 2016-11-14 ENCOUNTER — Ambulatory Visit (INDEPENDENT_AMBULATORY_CARE_PROVIDER_SITE_OTHER): Payer: Medicare Other | Admitting: Podiatry

## 2016-11-14 ENCOUNTER — Encounter: Payer: Self-pay | Admitting: Podiatry

## 2016-11-14 DIAGNOSIS — E1149 Type 2 diabetes mellitus with other diabetic neurological complication: Secondary | ICD-10-CM

## 2016-11-14 DIAGNOSIS — B351 Tinea unguium: Secondary | ICD-10-CM

## 2016-11-14 DIAGNOSIS — M79676 Pain in unspecified toe(s): Secondary | ICD-10-CM | POA: Diagnosis not present

## 2016-11-14 NOTE — Progress Notes (Signed)
Patient ID: Mathew Wright, male   DOB: Jul 21, 1935, 81 y.o.   MRN: [id number]  Subjective: 81 y.o.-year-old male returns the office today for elongated, thickened toenails which he is unable to trim himself. Denies any redness or drainage around the nails. Denies any acute changes since last appointment and no new complaints today. Denies any systemic complaints such as fevers, chills, nausea, vomiting.   Objective: AAO 3, NAD DP/PT pulses palpable, CRT less than 3 seconds Protective sensation decreased with Simms Weinstein monofilament  Nails hypertrophic, dystrophic, elongated, brittle, discolored 10. There is tenderness overlying these nails 1-5 bilaterally. There is no surrounding erythema or drainage along the nail sites. No open lesions or pre-ulcerative lesions are identified. No interdigital maceration. Dry, xerotic skin to bilateral heels. No fissuring.  No other areas of tenderness bilateral lower extremities. No overlying edema, erythema, increased warmth. No pain with calf compression, swelling, warmth, erythema.  Assessment: Patient presents with symptomatic onychomycosis, neuropathy  Plan: -Treatment options including alternatives, risks, complications were discussed -Nails sharply debrided 10 without complication/bleeding. -Discussed daily foot inspection. If there are any changes, to call the office immediately.  -Follow-up in 3 months or sooner if any problems are to arise. In the meantime, encouraged to call the office with any questions, concerns, changes symptoms.  Celesta Gentile, DPM

## 2016-11-18 DIAGNOSIS — C801 Malignant (primary) neoplasm, unspecified: Secondary | ICD-10-CM

## 2016-11-18 HISTORY — DX: Malignant (primary) neoplasm, unspecified: C80.1

## 2017-03-03 ENCOUNTER — Ambulatory Visit (INDEPENDENT_AMBULATORY_CARE_PROVIDER_SITE_OTHER): Payer: Medicare Other | Admitting: Podiatry

## 2017-03-03 ENCOUNTER — Encounter: Payer: Self-pay | Admitting: Podiatry

## 2017-03-03 DIAGNOSIS — B351 Tinea unguium: Secondary | ICD-10-CM | POA: Diagnosis not present

## 2017-03-03 DIAGNOSIS — M79676 Pain in unspecified toe(s): Secondary | ICD-10-CM

## 2017-03-03 DIAGNOSIS — E1149 Type 2 diabetes mellitus with other diabetic neurological complication: Secondary | ICD-10-CM

## 2017-03-03 NOTE — Progress Notes (Signed)
Complaint:  Visit Type: Patient returns to my office for continued preventative foot care services. Complaint: Patient states" my nails have grown long and thick and become painful to walk and wear shoes" Patient has been diagnosed with DM with no foot complications. The patient presents for preventative foot care services. No changes to ROS  Podiatric Exam: Vascular: dorsalis pedis and posterior tibial pulses are palpable bilateral. Capillary return is immediate. Temperature gradient is WNL. Skin turgor WNL  Sensorium: Diminished  Semmes Weinstein monofilament test. Normal tactile sensation bilaterally. Nail Exam: Pt has thick disfigured discolored nails with subungual debris noted bilateral entire nail hallux through fifth toenails Ulcer Exam: There is no evidence of ulcer or pre-ulcerative changes or infection. Orthopedic Exam: Muscle tone and strength are WNL. No limitations in general ROM. No crepitus or effusions noted. Foot type and digits show no abnormalities. Bony prominences are unremarkable. Skin: No Porokeratosis. No infection or ulcers  Diagnosis:  Onychomycosis, , Pain in right toe, pain in left toes  Treatment & Plan Procedures and Treatment: Consent by patient was obtained for treatment procedures. The patient understood the discussion of treatment and procedures well. All questions were answered thoroughly reviewed. Debridement of mycotic and hypertrophic toenails, 1 through 5 bilateral and clearing of subungual debris. No ulceration, no infection noted.  Return Visit-Office Procedure: Patient instructed to return to the office for a follow up visit 3 months for continued evaluation and treatment.    Kenadee Gates DPM 

## 2017-05-06 DIAGNOSIS — C679 Malignant neoplasm of bladder, unspecified: Secondary | ICD-10-CM | POA: Insufficient documentation

## 2017-06-02 ENCOUNTER — Ambulatory Visit (INDEPENDENT_AMBULATORY_CARE_PROVIDER_SITE_OTHER): Payer: Medicare Other | Admitting: Podiatry

## 2017-06-02 ENCOUNTER — Encounter: Payer: Self-pay | Admitting: Podiatry

## 2017-06-02 DIAGNOSIS — M79676 Pain in unspecified toe(s): Secondary | ICD-10-CM

## 2017-06-02 DIAGNOSIS — B351 Tinea unguium: Secondary | ICD-10-CM

## 2017-06-02 DIAGNOSIS — E1149 Type 2 diabetes mellitus with other diabetic neurological complication: Secondary | ICD-10-CM

## 2017-06-02 NOTE — Progress Notes (Signed)
Complaint:  Visit Type: Patient returns to my office for continued preventative foot care services. Complaint: Patient states" my nails have grown long and thick and become painful to walk and wear shoes" Patient has been diagnosed with DM with no foot complications. The patient presents for preventative foot care services. No changes to ROS  Podiatric Exam: Vascular: dorsalis pedis and posterior tibial pulses are palpable bilateral. Capillary return is immediate. Temperature gradient is WNL. Skin turgor WNL  Sensorium: Diminished  Semmes Weinstein monofilament test. Normal tactile sensation bilaterally. Nail Exam: Pt has thick disfigured discolored nails with subungual debris noted bilateral entire nail hallux through fifth toenails Ulcer Exam: There is no evidence of ulcer or pre-ulcerative changes or infection. Orthopedic Exam: Muscle tone and strength are WNL. No limitations in general ROM. No crepitus or effusions noted. Foot type and digits show no abnormalities. Bony prominences are unremarkable. Skin: No Porokeratosis. No infection or ulcers  Diagnosis:  Onychomycosis, , Pain in right toe, pain in left toes  Treatment & Plan Procedures and Treatment: Consent by patient was obtained for treatment procedures. The patient understood the discussion of treatment and procedures well. All questions were answered thoroughly reviewed. Debridement of mycotic and hypertrophic toenails, 1 through 5 bilateral and clearing of subungual debris. No ulceration, no infection noted. Patient says he has shooting pain since yesterday.  No symptoms noted upon exam. Return Visit-Office Procedure: Patient instructed to return to the office for a follow up visit 3 months for continued evaluation and treatment.    Gardiner Barefoot DPM

## 2017-06-09 DIAGNOSIS — Z9181 History of falling: Secondary | ICD-10-CM | POA: Insufficient documentation

## 2017-06-09 DIAGNOSIS — R29818 Other symptoms and signs involving the nervous system: Secondary | ICD-10-CM | POA: Insufficient documentation

## 2017-08-18 DIAGNOSIS — E860 Dehydration: Secondary | ICD-10-CM

## 2017-08-18 HISTORY — DX: Dehydration: E86.0

## 2017-08-25 DIAGNOSIS — I9589 Other hypotension: Secondary | ICD-10-CM

## 2017-08-25 DIAGNOSIS — E861 Hypovolemia: Secondary | ICD-10-CM | POA: Insufficient documentation

## 2017-08-25 DIAGNOSIS — K6389 Other specified diseases of intestine: Secondary | ICD-10-CM | POA: Insufficient documentation

## 2017-09-01 ENCOUNTER — Ambulatory Visit: Payer: Medicare Other | Admitting: Podiatry

## 2017-12-15 ENCOUNTER — Encounter: Payer: Self-pay | Admitting: Podiatry

## 2017-12-15 ENCOUNTER — Ambulatory Visit (INDEPENDENT_AMBULATORY_CARE_PROVIDER_SITE_OTHER): Payer: Medicare Other | Admitting: Podiatry

## 2017-12-15 DIAGNOSIS — B351 Tinea unguium: Secondary | ICD-10-CM

## 2017-12-15 DIAGNOSIS — M79676 Pain in unspecified toe(s): Secondary | ICD-10-CM

## 2017-12-15 DIAGNOSIS — E1149 Type 2 diabetes mellitus with other diabetic neurological complication: Secondary | ICD-10-CM

## 2017-12-15 DIAGNOSIS — E291 Testicular hypofunction: Secondary | ICD-10-CM | POA: Insufficient documentation

## 2017-12-15 DIAGNOSIS — E785 Hyperlipidemia, unspecified: Secondary | ICD-10-CM | POA: Insufficient documentation

## 2017-12-15 NOTE — Progress Notes (Addendum)
Complaint:  Visit Type: Patient returns to my office for continued preventative foot care services. Complaint: Patient states" my nails have grown long and thick and become painful to walk and wear shoes" Patient has been diagnosed with DM with no foot complications. The patient presents for preventative foot care services. No changes to ROS.  Patient missed his last appointment due to cancer treatment.  Podiatric Exam: Vascular: dorsalis pedis and posterior tibial pulses are palpable bilateral. Capillary return is immediate. Temperature gradient is WNL. Skin turgor WNL  Sensorium: Diminished  Semmes Weinstein monofilament test. Normal tactile sensation bilaterally. Nail Exam: Pt has thick disfigured discolored nails with subungual debris noted bilateral entire nail hallux through fifth toenails Ulcer Exam: There is no evidence of ulcer or pre-ulcerative changes or infection. Orthopedic Exam: Muscle tone and strength are WNL. No limitations in general ROM. No crepitus or effusions noted. Foot type and digits show no abnormalities. Bony prominences are unremarkable. Skin: No Porokeratosis. No infection or ulcers  Diagnosis:  Onychomycosis, , Pain in right toe, pain in left toes  Treatment & Plan Procedures and Treatment: Consent by patient was obtained for treatment procedures. The patient understood the discussion of treatment and procedures well. All questions were answered thoroughly reviewed. Debridement of mycotic and hypertrophic toenails, 1 through 5 bilateral and clearing of subungual debris. No ulceration, no infection noted. ABN signed for 2019. Return Visit-Office Procedure: Patient instructed to return to the office for a follow up visit 3 months for continued evaluation and treatment.    Cole Klugh DPM 

## 2017-12-23 ENCOUNTER — Emergency Department: Payer: Medicare Other

## 2017-12-23 ENCOUNTER — Emergency Department
Admission: EM | Admit: 2017-12-23 | Discharge: 2017-12-23 | Disposition: A | Discharge status: Home / Self Care | Payer: Medicare Other | Attending: Emergency Medicine | Admitting: Emergency Medicine

## 2017-12-23 ENCOUNTER — Other Ambulatory Visit: Payer: Self-pay

## 2017-12-23 DIAGNOSIS — I10 Essential (primary) hypertension: Secondary | ICD-10-CM | POA: Insufficient documentation

## 2017-12-23 DIAGNOSIS — E119 Type 2 diabetes mellitus without complications: Secondary | ICD-10-CM | POA: Diagnosis not present

## 2017-12-23 DIAGNOSIS — Z87891 Personal history of nicotine dependence: Secondary | ICD-10-CM | POA: Diagnosis not present

## 2017-12-23 DIAGNOSIS — E860 Dehydration: Secondary | ICD-10-CM

## 2017-12-23 DIAGNOSIS — Z79899 Other long term (current) drug therapy: Secondary | ICD-10-CM | POA: Diagnosis not present

## 2017-12-23 DIAGNOSIS — Z8551 Personal history of malignant neoplasm of bladder: Secondary | ICD-10-CM | POA: Diagnosis not present

## 2017-12-23 DIAGNOSIS — Z7982 Long term (current) use of aspirin: Secondary | ICD-10-CM | POA: Insufficient documentation

## 2017-12-23 DIAGNOSIS — Z7984 Long term (current) use of oral hypoglycemic drugs: Secondary | ICD-10-CM | POA: Insufficient documentation

## 2017-12-23 DIAGNOSIS — R531 Weakness: Secondary | ICD-10-CM | POA: Diagnosis present

## 2017-12-23 DIAGNOSIS — R197 Diarrhea, unspecified: Secondary | ICD-10-CM | POA: Diagnosis not present

## 2017-12-23 DIAGNOSIS — N390 Urinary tract infection, site not specified: Secondary | ICD-10-CM | POA: Diagnosis not present

## 2017-12-23 LAB — COMPREHENSIVE METABOLIC PANEL
ALT: 21 U/L (ref 17–63)
AST: 20 U/L (ref 15–41)
Albumin: 3.9 g/dL (ref 3.5–5.0)
Alkaline Phosphatase: 67 U/L (ref 38–126)
Anion gap: 10 (ref 5–15)
BUN: 22 mg/dL — ABNORMAL HIGH (ref 6–20)
CO2: 22 mmol/L (ref 22–32)
Calcium: 8.6 mg/dL — ABNORMAL LOW (ref 8.9–10.3)
Chloride: 103 mmol/L (ref 101–111)
Creatinine, Ser: 1.21 mg/dL (ref 0.61–1.24)
GFR calc Af Amer: >60 mL/min (ref >60)
GFR calc non Af Amer: 55 mL/min — ABNORMAL LOW (ref >60)
Glucose, Bld: 112 mg/dL — ABNORMAL HIGH (ref 65–99)
Potassium: 3.8 mmol/L (ref 3.5–5.1)
Sodium: 135 mmol/L (ref 135–145)
Total Bilirubin: 0.8 mg/dL (ref 0.3–1.2)
Total Protein: 7.1 g/dL (ref 6.5–8.1)

## 2017-12-23 LAB — URINALYSIS, COMPLETE (UACMP) WITH MICROSCOPIC
Bacteria, UA: NONE SEEN
Bilirubin Urine: NEGATIVE
Glucose, UA: NEGATIVE mg/dL
Hgb urine dipstick: NEGATIVE
Ketones, ur: NEGATIVE mg/dL
Nitrite: NEGATIVE
Protein, ur: NEGATIVE mg/dL
Specific Gravity, Urine: 1.025 (ref 1.005–1.030)
pH: 5 (ref 5.0–8.0)

## 2017-12-23 LAB — CBC
HCT: 38.9 % — ABNORMAL LOW (ref 40.0–52.0)
Hemoglobin: 13 g/dL (ref 13.0–18.0)
MCH: 32.2 pg (ref 26.0–34.0)
MCHC: 33.4 g/dL (ref 32.0–36.0)
MCV: 96.5 fL (ref 80.0–100.0)
Platelets: 214 10*3/uL (ref 150–440)
RBC: 4.03 MIL/uL — ABNORMAL LOW (ref 4.40–5.90)
RDW: 14 % (ref 11.5–14.5)
WBC: 18.9 10*3/uL — ABNORMAL HIGH (ref 3.8–10.6)

## 2017-12-23 LAB — LIPASE, BLOOD: Lipase: 23 U/L (ref 11–51)

## 2017-12-23 MED ORDER — CEPHALEXIN 500 MG PO CAPS
500.0000 mg | ORAL_CAPSULE | Freq: Three times a day (TID) | ORAL | 0 refills | Status: DC
Start: 2017-12-23 — End: 2020-03-09

## 2017-12-23 MED ORDER — CEFTRIAXONE SODIUM 1 G IJ SOLR
1.0000 g | Freq: Once | INTRAMUSCULAR | Status: AC
  Administered 2017-12-23: 1 g via INTRAVENOUS
  Filled 2017-12-23: qty 10

## 2017-12-23 MED ORDER — SODIUM CHLORIDE 0.9 % IV BOLUS (SEPSIS)
1000.0000 mL | Freq: Once | INTRAVENOUS | Status: AC
  Administered 2017-12-23: 1000 mL via INTRAVENOUS

## 2017-12-23 NOTE — ED Provider Notes (Signed)
[facility] Emergency Department Provider Note  ____________________________________________   I have reviewed the triage vital signs and the nursing notes.   HISTORY  Chief Complaint Weakness, diarrhea  History limited by: Not Limited   HPI Mathew Wright is a 83 y.o. male who presents to the emergency department today with primary concern for weakness as well as diarrhea.  He states the diarrhea started last night.  It lasted for roughly 8 hours.  Multiple episodes of nonbloody diarrhea.  He had no associated abdominal pain.  Since that time he is felt weak.  He is felt like he has had less energy than normal.  He denies any shortness of breath or chest pain.  Denies any cough.  Has not had any measured fevers although has had chills and sweating episodes.   Per medical record review patient has a history of DM, HTN  Past Medical History:  Diagnosis Date  . Diabetes mellitus without complication ([place])   . Hypertension     Patient Active Problem List   Diagnosis Date Noted  . HLD (hyperlipidemia) 12/15/2017  . Hypogonadism male 12/15/2017  . Bacterial overgrowth syndrome 08/25/2017  . Hypotension due to hypovolemia 08/25/2017  . Abnormal Romberg test 06/09/2017  . At risk for falls 06/09/2017  . Bladder cancer ([place]) 05/06/2017  . Advanced directives, counseling/discussion 05/03/2016  . Polycythemia 04/05/2013  . BCC (basal cell carcinoma), face 03/03/2013  . History of nonmelanoma skin cancer 01/26/2013  . 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 Posterior subcapsular polar senile cataract 07/19/2011  . Vitreous degeneration 07/19/2011  . Actinic keratosis 01/24/2011  . Type II diabetes mellitus ([place]) 10/23/2010  . Fatty liver disease, nonalcoholic 14/48/1856  . Osteopenia 02/18/2008  . Adenomatous polyp of colon 05/04/2007  . Diverticulosis of colon 04/22/2000  . Essential tremor 08/24/1996    History reviewed. No  pertinent surgical history.  Prior to Admission medications   Medication Sig Start Date End Date Taking? Authorizing Provider  amLODipine (NORVASC) 10 MG tablet Take 10 mg by mouth daily.     [provider]  aspirin 81 MG tablet Take 81 mg by mouth daily.    [provider]  dorzolamide-timolol (COSOPT) 22.3-6.8 MG/ML ophthalmic solution Place 1 drop into both eyes 2 (two) times daily.  10/18/13   [provider]  enalapril (VASOTEC) 10 MG tablet Take 10 mg by mouth 2 (two) times daily.  12/20/13   [provider]  finasteride (PROSCAR) 5 MG tablet Take 5 mg by mouth daily.  02/18/14   [provider]  latanoprost (XALATAN) 0.005 % ophthalmic solution Place 1 drop in right eye at bedtime    [provider]  metFORMIN (GLUCOPHAGE) 500 MG tablet Take by mouth 2 (two) times daily with a meal.    [provider]  metoprolol succinate (TOPROL-XL) 100 MG 24 hr tablet Take 100 mg by mouth daily.  12/20/13   [provider]  Multiple Vitamins-Minerals (CENTRUM SILVER PO) Take 1 tablet by mouth daily.     [provider]  primidone (MYSOLINE) 50 MG tablet Take 100-150 mg by mouth See admin instructions. Take 100 mg by mouth in the morning and take 150 mg by mouth at bedtime 12/21/13   [provider]  propranolol (INDERAL) 10 MG tablet Take 10-20 mg by mouth 2 (two) times daily as needed (for tremors).  11/19/13   [provider]  simvastatin (ZOCOR) 20 MG tablet Take 20 mg by mouth at bedtime.  10/27/13   [provider]  tamsulosin (FLOMAX) 0.4 MG CAPS capsule Take 0.8 mg by mouth daily.  12/02/17   [provider]  Testosterone (TESTOPEL) 75 MG PLLT Place 1 implant in skin every 4 months 11/05/11   [provider]    Allergies Shellfish allergy and Fish-derived products  No family history on file.  Social History Social History   Tobacco Use  . Smoking status: Former Research scientist (life sciences)  .  Smokeless tobacco: Never Used  . Tobacco comment: quit  Substance Use Topics  . Alcohol use: No  . Drug use: No    Review of Systems Constitutional: No fever/chills. Positive for generalized weakness Eyes: No visual changes. ENT: No sore throat. Cardiovascular: Denies chest pain. Respiratory: Denies shortness of breath. Gastrointestinal: No abdominal pain.  Positive for diarrhea Genitourinary: Negative for dysuria. Musculoskeletal: Negative for back pain. Skin: Negative for rash. Neurological: Negative for headaches, focal weakness or numbness.  ____________________________________________   PHYSICAL EXAM:  VITAL SIGNS: ED Triage Vitals  Enc Vitals Group     BP 12/23/17 1823 (!) 130/111     Pulse Rate 12/23/17 1823 91     Resp 12/23/17 1823 18     Temp 12/23/17 1823 98.3 F (36.8 C)     Temp Source 12/23/17 1823 Oral     SpO2 12/23/17 1823 97 %     Weight 12/23/17 1824 190 lb (86.2 kg)     Height 12/23/17 1824 6' (1.829 m)     Head Circumference --      Peak Flow --      Pain Score 12/23/17 1824 7   Constitutional: Alert and oriented. Well appearing and in no distress. Eyes: Conjunctivae are normal.  ENT   Head: Normocephalic and atraumatic.   Nose: No congestion/rhinnorhea.   Mouth/Throat: Mucous membranes are moist.   Neck: No stridor. Hematological/Lymphatic/Immunilogical: No cervical lymphadenopathy. Cardiovascular: Normal rate, regular rhythm.  No murmurs, rubs, or gallops. Respiratory: Normal respiratory effort without tachypnea nor retractions. Breath sounds are clear and equal bilaterally. No wheezes/rales/rhonchi. Gastrointestinal: Soft and non tender. No rebound. No guarding.  Genitourinary: Deferred Musculoskeletal: Normal range of motion in all extremities. No lower extremity edema. Neurologic:  Normal speech and language. No gross focal neurologic deficits are appreciated.  Skin:  Skin is warm, dry and intact. No rash noted. Psychiatric:  Mood and affect are normal. Speech and behavior are normal. Patient exhibits appropriate insight and judgment.  ____________________________________________    LABS (pertinent positives/negatives)  Cbc wbc 18.9, hgb 13.0, plt 214 Lipase 23 CMP cr 1.21, ca 8.6 UA trace leukocytes, 6-30 wbc  ____________________________________________   EKG  None  ____________________________________________    RADIOLOGY  CXR No acute disease  ____________________________________________   PROCEDURES  Procedures  ____________________________________________   INITIAL IMPRESSION / ASSESSMENT AND PLAN / ED COURSE  Pertinent labs & imaging results that were available during my care of the patient were reviewed by me and considered in my medical decision making (see chart for details).  Presented to the emergency department today with primary concerns for weakness and diarrhea.  While I do think some of the weakness could be attributed to dehydration secondary to the diarrhea his urine did come back with some signs concerning for urinary tract infection.  The patient had a chest x-ray which did not show any signs of pneumonia.  Blood work without concerning signs of anemia or electrolyte abnormality.  Discussed workup and findings with patient and family.  Will give patient dose of  antibiotic here in the emergency department for urinary tract infection plan on discharging with further.   ____________________________________________   FINAL CLINICAL IMPRESSION(S) / ED DIAGNOSES  Final diagnoses:  Lower urinary tract infection  Diarrhea, unspecified type  Dehydration     Note: This dictation was prepared with Dragon dictation. Any transcriptional errors that result from this process are unintentional     Nance Pear, MD 12/23/17 2234

## 2017-12-23 NOTE — Discharge Instructions (Signed)
Please seek medical attention for any high fevers, chest pain, shortness of breath, change in behavior, persistent vomiting, bloody stool or any other new or concerning symptoms.  

## 2017-12-23 NOTE — ED Triage Notes (Signed)
Pt c/o having hot and cold flashes with bodyaches since around 230am this morning with diarrhea. Denies N/V/fever.Marland Kitchen

## 2017-12-23 NOTE — ED Notes (Signed)
Pt taken to xray 

## 2017-12-27 LAB — URINE CULTURE: Culture: <10000 — AB

## 2017-12-29 ENCOUNTER — Encounter: Payer: Self-pay | Admitting: *Deleted

## 2018-01-01 ENCOUNTER — Ambulatory Visit: Payer: Medicare Other | Admitting: Anesthesiology

## 2018-01-01 ENCOUNTER — Ambulatory Visit
Admission: RE | Admit: 2018-01-01 | Discharge: 2018-01-01 | Disposition: A | Discharge status: Home / Self Care | Payer: Medicare Other | Source: Ambulatory Visit | Attending: Ophthalmology | Admitting: Ophthalmology

## 2018-01-01 ENCOUNTER — Encounter
Admission: RE | Disposition: A | Discharge status: Home / Self Care | Payer: Self-pay | Source: Ambulatory Visit | Attending: Ophthalmology

## 2018-01-01 ENCOUNTER — Encounter: Payer: Self-pay | Admitting: Emergency Medicine

## 2018-01-01 DIAGNOSIS — E1136 Type 2 diabetes mellitus with diabetic cataract: Secondary | ICD-10-CM | POA: Diagnosis not present

## 2018-01-01 DIAGNOSIS — Z79890 Hormone replacement therapy (postmenopausal): Secondary | ICD-10-CM | POA: Insufficient documentation

## 2018-01-01 DIAGNOSIS — Z79899 Other long term (current) drug therapy: Secondary | ICD-10-CM | POA: Diagnosis not present

## 2018-01-01 DIAGNOSIS — H2511 Age-related nuclear cataract, right eye: Secondary | ICD-10-CM | POA: Diagnosis present

## 2018-01-01 DIAGNOSIS — Z87891 Personal history of nicotine dependence: Secondary | ICD-10-CM | POA: Diagnosis not present

## 2018-01-01 DIAGNOSIS — I10 Essential (primary) hypertension: Secondary | ICD-10-CM | POA: Insufficient documentation

## 2018-01-01 DIAGNOSIS — Z7984 Long term (current) use of oral hypoglycemic drugs: Secondary | ICD-10-CM | POA: Diagnosis not present

## 2018-01-01 DIAGNOSIS — Z7982 Long term (current) use of aspirin: Secondary | ICD-10-CM | POA: Insufficient documentation

## 2018-01-01 DIAGNOSIS — M1990 Unspecified osteoarthritis, unspecified site: Secondary | ICD-10-CM | POA: Insufficient documentation

## 2018-01-01 DIAGNOSIS — Z8551 Personal history of malignant neoplasm of bladder: Secondary | ICD-10-CM | POA: Diagnosis not present

## 2018-01-01 DIAGNOSIS — E7800 Pure hypercholesterolemia, unspecified: Secondary | ICD-10-CM | POA: Diagnosis not present

## 2018-01-01 DIAGNOSIS — G709 Myoneural disorder, unspecified: Secondary | ICD-10-CM | POA: Insufficient documentation

## 2018-01-01 DIAGNOSIS — R251 Tremor, unspecified: Secondary | ICD-10-CM | POA: Diagnosis not present

## 2018-01-01 HISTORY — DX: Pure hypercholesterolemia, unspecified: E78.00

## 2018-01-01 HISTORY — DX: Unspecified osteoarthritis, unspecified site: M19.90

## 2018-01-01 HISTORY — DX: Malignant (primary) neoplasm, unspecified: C80.1

## 2018-01-01 HISTORY — PX: CATARACT EXTRACTION W/PHACO: SHX586

## 2018-01-01 HISTORY — DX: Myoneural disorder, unspecified: G70.9

## 2018-01-01 HISTORY — DX: Unspecified hearing loss, unspecified ear: H91.90

## 2018-01-01 HISTORY — DX: Pneumonia, unspecified organism: J18.9

## 2018-01-01 HISTORY — DX: Dehydration: E86.0

## 2018-01-01 LAB — GLUCOSE, CAPILLARY: Glucose-Capillary: 141 mg/dL — ABNORMAL HIGH (ref 65–99)

## 2018-01-01 SURGERY — PHACOEMULSIFICATION, CATARACT, WITH IOL INSERTION
Anesthesia: Monitor Anesthesia Care | Site: Eye | Laterality: Right | Wound class: Clean

## 2018-01-01 MED ORDER — MOXIFLOXACIN HCL 0.5 % OP SOLN
OPHTHALMIC | Status: DC | PRN
  Administered 2018-01-01: 0.2 mL via OPHTHALMIC

## 2018-01-01 MED ORDER — SODIUM CHLORIDE 0.9 % IV SOLN
INTRAVENOUS | Status: DC
  Administered 2018-01-01: 07:00:00 via INTRAVENOUS

## 2018-01-01 MED ORDER — PHENYLEPHRINE HCL 10 % OP SOLN
1.0000 [drp] | OPHTHALMIC | Status: DC | PRN
  Administered 2018-01-01: 1 [drp] via OPHTHALMIC

## 2018-01-01 MED ORDER — POVIDONE-IODINE 5 % OP SOLN
OPHTHALMIC | Status: DC | PRN
  Administered 2018-01-01: 1 via OPHTHALMIC

## 2018-01-01 MED ORDER — LIDOCAINE HCL (PF) 4 % IJ SOLN
INTRAMUSCULAR | Status: AC
  Filled 2018-01-01: qty 5

## 2018-01-01 MED ORDER — LIDOCAINE HCL (PF) 4 % IJ SOLN
INTRAOCULAR | Status: DC | PRN
  Administered 2018-01-01: 4 mL via OPHTHALMIC

## 2018-01-01 MED ORDER — TRYPAN BLUE 0.06 % OP SOLN
OPHTHALMIC | Status: DC | PRN
  Administered 2018-01-01: 0.5 mL via INTRAOCULAR

## 2018-01-01 MED ORDER — FENTANYL CITRATE (PF) 100 MCG/2ML IJ SOLN
INTRAMUSCULAR | Status: AC
  Filled 2018-01-01: qty 2

## 2018-01-01 MED ORDER — TRYPAN BLUE 0.06 % OP SOLN
OPHTHALMIC | Status: AC
  Filled 2018-01-01: qty 0.5

## 2018-01-01 MED ORDER — SODIUM HYALURONATE 23 MG/ML IO SOLN
INTRAOCULAR | Status: AC
  Filled 2018-01-01: qty 0.6

## 2018-01-01 MED ORDER — PHENYLEPHRINE HCL 10 % OP SOLN
OPHTHALMIC | Status: AC
  Filled 2018-01-01: qty 5

## 2018-01-01 MED ORDER — SODIUM HYALURONATE 10 MG/ML IO SOLN
INTRAOCULAR | Status: DC | PRN
  Administered 2018-01-01: 0.55 mL via INTRAOCULAR

## 2018-01-01 MED ORDER — CARBACHOL 0.01 % IO SOLN
INTRAOCULAR | Status: DC | PRN
  Administered 2018-01-01: 0.5 mL via INTRAOCULAR

## 2018-01-01 MED ORDER — ARMC OPHTHALMIC DILATING DROPS
OPHTHALMIC | Status: AC
  Administered 2018-01-01: 1 via OPHTHALMIC
  Filled 2018-01-01: qty 0.4

## 2018-01-01 MED ORDER — MOXIFLOXACIN HCL 0.5 % OP SOLN
OPHTHALMIC | Status: AC
  Filled 2018-01-01: qty 3

## 2018-01-01 MED ORDER — EPINEPHRINE PF 1 MG/ML IJ SOLN
INTRAOCULAR | Status: DC | PRN
  Administered 2018-01-01: 09:00:00 via OPHTHALMIC

## 2018-01-01 MED ORDER — SODIUM HYALURONATE 23 MG/ML IO SOLN
INTRAOCULAR | Status: DC | PRN
  Administered 2018-01-01: 0.6 mL via INTRAOCULAR

## 2018-01-01 MED ORDER — ARMC OPHTHALMIC DILATING DROPS
1.0000 "application " | OPHTHALMIC | Status: AC
  Administered 2018-01-01 (×3): 1 via OPHTHALMIC

## 2018-01-01 MED ORDER — ONDANSETRON HCL 4 MG/2ML IJ SOLN
INTRAMUSCULAR | Status: AC
  Filled 2018-01-01: qty 2

## 2018-01-01 MED ORDER — MOXIFLOXACIN HCL 0.5 % OP SOLN: 1.0000 [drp] | Freq: Once | OPHTHALMIC | Status: DC

## 2018-01-01 MED ORDER — POVIDONE-IODINE 5 % OP SOLN
OPHTHALMIC | Status: AC
  Filled 2018-01-01: qty 30

## 2018-01-01 MED ORDER — ONDANSETRON HCL 4 MG/2ML IJ SOLN
INTRAMUSCULAR | Status: DC | PRN
  Administered 2018-01-01: 4 mg via INTRAVENOUS

## 2018-01-01 MED ORDER — EPINEPHRINE PF 1 MG/ML IJ SOLN
INTRAMUSCULAR | Status: AC
  Filled 2018-01-01: qty 2

## 2018-01-01 MED ORDER — FENTANYL CITRATE (PF) 100 MCG/2ML IJ SOLN
INTRAMUSCULAR | Status: DC | PRN
  Administered 2018-01-01 (×2): 25 ug via INTRAVENOUS

## 2018-01-01 SURGICAL SUPPLY — 18 items
CANNULA ANT/CHMB 27GA (MISCELLANEOUS) ×2 IMPLANT
DISSECTOR HYDRO NUCLEUS 50X22 (MISCELLANEOUS) ×2 IMPLANT
GLOVE BIO SURGEON STRL SZ8 (GLOVE) ×2 IMPLANT
GLOVE BIOGEL M 6.5 STRL (GLOVE) ×2 IMPLANT
GLOVE SURG LX 7.5 STRW (GLOVE) ×1
GLOVE SURG LX STRL 7.5 STRW (GLOVE) ×1 IMPLANT
GOWN STRL REUS W/ TWL LRG LVL3 (GOWN DISPOSABLE) ×2 IMPLANT
GOWN STRL REUS W/TWL LRG LVL3 (GOWN DISPOSABLE) ×2
LABEL CATARACT MEDS ST (LABEL) ×2 IMPLANT
LENS IOL TECNIS ITEC 10.5 (Intraocular Lens) ×2 IMPLANT
PACK CATARACT (MISCELLANEOUS) ×2 IMPLANT
PACK CATARACT KING (MISCELLANEOUS) ×2 IMPLANT
PACK EYE AFTER SURG (MISCELLANEOUS) ×2 IMPLANT
SOL BSS BAG (MISCELLANEOUS) ×2
SOLUTION BSS BAG (MISCELLANEOUS) ×1 IMPLANT
SYR 3ML LL SCALE MARK (SYRINGE) ×2 IMPLANT
WATER STERILE IRR 250ML POUR (IV SOLUTION) ×2 IMPLANT
WIPE NON LINTING 3.25X3.25 (MISCELLANEOUS) ×2 IMPLANT

## 2018-01-01 NOTE — Op Note (Signed)
OPERATIVE NOTE  Mathew Wright [id number] 01/01/2018   PREOPERATIVE DIAGNOSIS:  Nuclear sclerotic cataract right eye.  H25.11   POSTOPERATIVE DIAGNOSIS:    Nuclear sclerotic cataract right eye.     PROCEDURE:  Phacoemusification with posterior chamber intraocular lens placement of the right eye   LENS:   Implant Name Type Inv. Item Serial No. Manufacturer Lot No. LRB No. Used  LENS IOL DIOP 10.5 - [id number] Intraocular Lens LENS IOL DIOP 10.5 [id number] AMO  Right 1       PCB00 +10.5   ULTRASOUND TIME: 0 minutes 58 seconds.  CDE 9.55   SURGEON:  Benay Pillow, MD, MPH  ANESTHESIOLOGIST: Anesthesiologist: Piscitello, Precious Haws, MD CRNA: Doreen Salvage, CRNA   ANESTHESIA:  Topical with tetracaine drops augmented with 1% preservative-free intracameral lidocaine.  ESTIMATED BLOOD LOSS: less than 1 mL.   COMPLICATIONS:  None.   DESCRIPTION OF PROCEDURE:  The patient was identified in the holding room and transported to the operating room and placed in the supine position under the operating microscope.  The right eye was identified as the operative eye and it was prepped and draped in the usual sterile ophthalmic fashion.   A 1.0 millimeter clear-corneal paracentesis was made at the 10:30 position. 0.5 ml of preservative-free 1% lidocaine with epinephrine was injected into the anterior chamber.  There was a poor red reflex, so Trypan blue was instilled under an air bubble and rinsed out with BSS to stain the capsule for improved visualization.  The pupil was small but was managed with viscodilation.    The anterior chamber was filled with Healon 5 viscoelastic.  A 2.4 millimeter keratome was used to make a near-clear corneal incision at the 8:00 position.  A curvilinear capsulorrhexis was made with a cystotome and capsulorrhexis forceps.  Balanced salt solution was used to hydrodissect and hydrodelineate the nucleus.   Phacoemulsification was then used in stop and chop fashion to  remove the lens nucleus and epinucleus.  The remaining cortex was then removed using the irrigation and aspiration handpiece. Healon was then placed into the capsular bag to distend it for lens placement.  A lens was then injected into the capsular bag.  The remaining viscoelastic was aspirated.   Wounds were hydrated with balanced salt solution.  The anterior chamber was inflated to a physiologic pressure with balanced salt solution.   Intracameral vigamox 0.1 mL undiluted was injected into the eye and a drop placed onto the ocular surface.  No wound leaks were noted.  The patient was taken to the recovery room in stable condition without complications of anesthesia or surgery  Benay Pillow 01/01/2018, 9:47 AM

## 2018-01-01 NOTE — H&P (Signed)
The History and Physical notes are on paper, have been signed, and are to be scanned.   I have examined the patient and there are no changes to the H&P.   Benay Pillow 01/01/2018 8:26 AM

## 2018-01-01 NOTE — Anesthesia Procedure Notes (Signed)
Procedure Name: MAC Date/Time: 01/01/2018 9:14 AM Performed by: Doreen Salvage, CRNA Pre-anesthesia Checklist: Patient identified, Emergency Drugs available, Suction available and Patient being monitored Patient Re-evaluated:Patient Re-evaluated prior to induction Oxygen Delivery Method: Nasal cannula

## 2018-01-01 NOTE — Anesthesia Post-op Follow-up Note (Signed)
Anesthesia QCDR form completed.        

## 2018-01-01 NOTE — Progress Notes (Signed)
Chaplain met with patient. Patient's pastor is with the family. 

## 2018-01-01 NOTE — Discharge Instructions (Signed)
FOLLOW DR. Melony Overly POSTOP EYE DROP INSTRUCTION SHEET AS REVIEWED.  Eye Surgery Discharge Instructions  Expect mild scratchy sensation or mild soreness. DO NOT RUB YOUR EYE!  The day of surgery:  Minimal physical activity, but bed rest is not required  No reading, computer work, or close hand work  No bending, lifting, or straining.  May watch TV  For 24 hours:  No driving, legal decisions, or alcoholic beverages  Safety precautions  Eat anything you prefer: It is better to start with liquids, then soup then solid foods.  _____ Eye patch should be worn until postoperative exam tomorrow.  ____ Solar shield eyeglasses should be worn for comfort in the sunlight/patch while sleeping  Resume all regular medications including aspirin or Coumadin if these were discontinued prior to surgery. You may shower, bathe, shave, or wash your hair. Tylenol may be taken for mild discomfort.  Call your doctor if you experience significant pain, nausea, or vomiting, fever > 101 or other signs of infection. [phone] or [phone] Specific instructions:  Follow-up Information    Eulogio Bear, MD Follow up.   Specialty:  Ophthalmology Why:  Friday 01/01/18 @ 9:45 am  Contact information: [place] [address] [phone]

## 2018-01-01 NOTE — Anesthesia Postprocedure Evaluation (Signed)
Anesthesia Post Note  Patient: Mathew Wright  Procedure(s) Performed: CATARACT EXTRACTION PHACO AND INTRAOCULAR LENS PLACEMENT (IOC) (Right Eye)  Patient location during evaluation: PACU Anesthesia Type: MAC Level of consciousness: awake and alert Pain management: pain level controlled Vital Signs Assessment: post-procedure vital signs reviewed and stable Respiratory status: spontaneous breathing, nonlabored ventilation, respiratory function stable and patient connected to nasal cannula oxygen Cardiovascular status: stable and blood pressure returned to baseline Postop Assessment: no apparent nausea or vomiting Anesthetic complications: no     Last Vitals:  Vitals:   01/01/18 0947 01/01/18 0948  BP: 118/63 118/63  Pulse: 70 70  Resp: 16 12  Temp: 36.7 C 36.7 C  SpO2: 100% 100%    Last Pain:  Vitals:   01/01/18 0948  TempSrc: Oral                 Alison Stalling

## 2018-01-01 NOTE — Anesthesia Preprocedure Evaluation (Signed)
Anesthesia Evaluation  Patient identified by MRN, date of birth, ID band Patient awake    Reviewed: Allergy & Precautions, H&P , NPO status , Patient's Chart, lab work & pertinent test results  Airway Mallampati: III  TM Distance: <3 FB Neck ROM: limited    Dental  (+) Chipped, Poor Dentition   Pulmonary neg shortness of breath, pneumonia, former smoker,           Cardiovascular Exercise Tolerance: Good hypertension, (-) angina(-) Past MI and (-) DOE      Neuro/Psych  Neuromuscular disease negative psych ROS   GI/Hepatic negative GI ROS, Neg liver ROS, neg GERD  ,  Endo/Other  diabetes, Type 2  Renal/GU      Musculoskeletal  (+) Arthritis ,   Abdominal   Peds  Hematology negative hematology ROS (+)   Anesthesia Other Findings Past Medical History: No date: Arthritis No date: Cancer [facility])     Comment:  bladder 08/2017: Dehydration     Comment:  was hospitalized for this at [facility] No date: Diabetes mellitus without complication ([place]) No date: HOH (hard of hearing)     Comment:  wears hearing aids No date: Hypercholesteremia No date: Hypertension No date: Neuromuscular disorder ([place])     Comment:  tremors...takes inderal as needed No date: Pneumonia     Comment:  bronchitis  Past Surgical History: No date: COLONOSCOPY  BMI    Body Mass Index:  25.77 kg/m      Reproductive/Obstetrics negative OB ROS                             Anesthesia Physical Anesthesia Plan  ASA: III  Anesthesia Plan: MAC   Post-op Pain Management:    Induction: Intravenous  PONV Risk Score and Plan:   Airway Management Planned: Natural Airway and Nasal Cannula  Additional Equipment:   Intra-op Plan:   Post-operative Plan:   Informed Consent: I have reviewed the patients History and Physical, chart, labs and discussed the procedure including the risks, benefits and alternatives for the  proposed anesthesia with the patient or authorized representative who has indicated his/her understanding and acceptance.   Dental Advisory Given  Plan Discussed with: Anesthesiologist, CRNA and Surgeon  Anesthesia Plan Comments: (Patient consented for risks of anesthesia including but not limited to:  - adverse reactions to medications - damage to teeth, lips or other oral mucosa - sore throat or hoarseness - Damage to heart, brain, lungs or loss of life  Patient voiced understanding.)        Anesthesia Quick Evaluation

## 2018-01-01 NOTE — Transfer of Care (Signed)
Immediate Anesthesia Transfer of Care Note  Patient: Mathew Wright  Procedure(s) Performed: Procedure(s) with comments: CATARACT EXTRACTION PHACO AND INTRAOCULAR LENS PLACEMENT (IOC) (Right) - [place] 00:58.0 AP% 16.4 CDE 9.55 Fluid Pack Lot # [id number]  Patient Location: PHASE II  Anesthesia Type:MAC  Level of Consciousness: Awake, Alert, Oriented  Airway & Oxygen Therapy: Patient Spontanous Breathing and Patient on room air   Post-op Assessment: Report given to RN and Post -op Vital signs reviewed and stable  Post vital signs: Reviewed and stable  Last Vitals:  Vitals:   01/01/18 0947 01/01/18 0948  BP: 118/63 118/63  Pulse: 70 70  Resp: 16 12  Temp: 36.7 C 36.7 C  SpO2: 846% 962%    Complications: No apparent anesthesia complications

## 2018-01-12 ENCOUNTER — Ambulatory Visit: Payer: Medicare Other | Admitting: Podiatry

## 2018-02-19 ENCOUNTER — Other Ambulatory Visit: Payer: Self-pay

## 2018-02-19 ENCOUNTER — Encounter: Payer: Self-pay | Admitting: Physical Therapy

## 2018-02-19 ENCOUNTER — Ambulatory Visit: Payer: Medicare Other | Attending: Gastroenterology | Admitting: Physical Therapy

## 2018-02-19 DIAGNOSIS — M6208 Separation of muscle (nontraumatic), other site: Secondary | ICD-10-CM | POA: Diagnosis present

## 2018-02-19 DIAGNOSIS — R279 Unspecified lack of coordination: Secondary | ICD-10-CM | POA: Insufficient documentation

## 2018-02-19 DIAGNOSIS — R2689 Other abnormalities of gait and mobility: Secondary | ICD-10-CM

## 2018-02-19 DIAGNOSIS — M4125 Other idiopathic scoliosis, thoracolumbar region: Secondary | ICD-10-CM | POA: Diagnosis present

## 2018-02-19 DIAGNOSIS — M2170 Unequal limb length (acquired), unspecified site: Secondary | ICD-10-CM | POA: Diagnosis present

## 2018-02-19 NOTE — Patient Instructions (Addendum)
  Decrease downward forces onto pelvic floor: Log roll in/ out of bed instead of jumping out of bed with a crunch   Exhale as you lift weights  or as you get out of the chair (against gravity and loads)   Proper  Sitting posture :  Feet on the ground, knees above feet   Proper body mechanics with getting out of a chair to decrease strain  on back &pelvic floor   Avoid holding your breath when Getting out of the chair:  Scoot to front part of chair chair Heels behind feet, feet are hip width apart, nose over toes  Inhale like you are smelling roses Exhale to stand

## 2018-02-20 NOTE — Therapy (Signed)
[place] MAIN [facility] SERVICES [address] [place], [place], [address] Phone: [phone]   Fax:  [phone]  Physical Therapy Evaluation  Patient Details  Name: Mathew Wright MRN: [id number] Date of Birth: 12-26-1934 Referring Provider: Josephina Gip   Encounter Date: 02/19/2018  PT End of Session - 02/20/18 1227    Visit Number  1    Number of Visits  12    PT Start Time  2671    PT Stop Time  1200    PT Time Calculation (min)  55 min    Activity Tolerance  Patient tolerated treatment well    Behavior During Therapy  [facility] for tasks assessed/performed       Past Medical History:  Diagnosis Date  . Arthritis   . Cancer ([place]) 2018   bladder  . Dehydration 08/2017   was hospitalized for this at [facility]  . Diabetes mellitus without complication ([place])   . HOH (hard of hearing)    wears hearing aids  . Hypercholesteremia   . Hypertension   . Neuromuscular disorder ([facility])    tremors...takes inderal as needed  . Pneumonia    bronchitis    Past Surgical History:  Procedure Laterality Date  . CATARACT EXTRACTION W/PHACO Right 01/01/2018   Procedure: CATARACT EXTRACTION PHACO AND INTRAOCULAR LENS PLACEMENT (IOC);  Surgeon: Eulogio Bear, MD;  Location: [facility] ORS;  Service: Ophthalmology;  Laterality: Right;  [place] 00:58.0 AP% 16.4 CDE 9.55 Fluid Pack Lot # [id number]  . COLONOSCOPY      There were no vitals filed for this visit.   Subjective Assessment - 02/19/18 1109    Subjective  Pt is feeling frustrated with his diarrhea problem.  Pt was Dx with bladder 2018 and under went radiation and chemotherapy. His MD tells him that he is cancer free. Pt has been explained by his GI MD that radiation has weakened his muscles but pt feels that is not the problem. He thinks it has something to do with his stomach and GI track taht is causing him to have constant diarrhea and gas, and bloating. Pt has taken probiotic drinks, Peptobismal, and  eating the right foods. The problem occurs after breakfast and he had to come home to go to the bathroom 5 x. One time he did not make it to the bathroom and he messed up his underwear which frustrated him.  Pt is afraid to leave his house something because he is not sure when the diarrhea will occur. Pt has been treated with C diff and has completd medications.  Diarrhea has seemed to have increased in frequency since his radiation Tx for CA. Pt has been taken Metamucil prior to CA and it helped him to have Type 4 stool consistency. After CA Tx, pt still takes MetaMucil but his stool consistency has been more Type 7.       2) Urinary leakage: Pt will lekaage if he does not go immediately     3) CLP for the past 5 year without injury.  The pain is located in the lower back without radiating pain. 5/10. But it does not keep him from doing things. The pain is more noticable after working out in the yard all day, with planting a garden, cutting grass, raking.           Pertinent History  Loaded activities: 5 x days / week at gym for 6-7 years: Treadmill 20 min, weight machines for UE/LE , does not have  a stretching routine.  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 Has help with an assistance who comes to the hours.  Difficulty feeling feet due to peripheral neuropathy for the past 4 years.  Pt feels he can lose his balance and staggers when walking       Patient Stated Goals  no messing up his clothes         [facility] PT Assessment - 02/20/18 1315      Assessment   Medical Diagnosis  fecal incontinence     Referring Provider  Josephina Gip      Precautions   Precautions  None      Balance Screen   Has the patient fallen in the past 6 months  No    Has the patient had a decrease in activity level because of a fear of falling?   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, building services residence      Prior Function   Level of Independence  Independent      Observation/Other Assessments   Observations  L medial malleloi to ASIS 94 cm, R 93.5        Coordination   Gross Motor Movements are Fluid and Coordinated  -- abdominal straining      Functional Tests   Functional tests  -- breathholding w/ simulated weight lifting      Squat   Comments  good ROM      Sit to Stand   Comments  breathholding, posterior COM       Floor to Stand   Comments  half kneeling with poor alignment but no difficulty        AROM   Overall AROM Comments  15% sidebend R, 10% L,  rotation 20%  limited,       Palpation   Spinal mobility  R 12th rib lower than L, R ASIS lower than L, increased throacic kyphosis, convex on L , increased lumbar lordosis        Bed Mobility   Bed Mobility  -- crunch method      Ambulation/Gait   Gait Pattern  -- decreased R LE stance, hip drop     Gait velocity  0.74 m/s                 Objective measurements completed on examination: See above findings.    Pelvic Floor Special Questions - 02/19/18 1202    Diastasis Recti  3 fingers along linea alba                     PT Long Term Goals - 02/19/18 1129      PT LONG TERM GOAL #1   Title  Pt will decrease the soiling of his pants from 2x/ day to < 1 x day in order to leave the house    Time  8    Period  Weeks    Status  New    Target Date  04/16/18      PT LONG TERM GOAL #2   Title  Pt will demo proper body mechanics with weight lifting and assistance with transfering his wife to minimize loading onhis body and to minimize Sx and injuries     Time  4    Period  Weeks  Status  New    Target Date  03/19/18      PT LONG TERM GOAL #3   Title  Pt will demo no gait deviations, more leveled shoulders, pelvis and proper sitting posture in order to maintain upright posture for optimal intraabdominal pressure system to progress to pelvic/abdominal  exercises     Time  6    Period  Weeks    Status  New    Target Date  04/03/18      PT LONG TERM GOAL #4   Title  Pt will decrease his COREFO score from % to < % inorder to improve bowel function    Time  12    Period  Weeks    Status  New    Target Date  05/15/18      PT LONG TERM GOAL #5   Title  Pt will increase his gait speed from 0.74 m/s  to > 1.2 m/s in order to ambulate safely with less risk for falls in the community    Time  8    Period  Weeks    Status  New    Target Date  04/17/18             Plan - 02/20/18 1228    Clinical Impression Statement    Pt is an 83 yo male who reports fecal incontinence, diarrhea, urinary leakage, and CLBP. Pt presents with limited spinal mobility, scoliosis, leg length difference, diastasis recti, dyscoordination and strength of pelvic floor mm,gait deviations, slowed gait speed, and poor body mechanics which places strain on the abdominal/pelvic floor mm. These are deficits that indicate an ineffective intraabdominal pressure system associated with his Sx. Pt had radiation and chemo for bladder cancer last year. Pt assists with transferring his wife with ADLs who requires physical assistance 2/2 dementia. Pt also lifts weights at the gym and lacks a flexibility routine.     Clinical Presentation  Evolving    Clinical Decision Making  Moderate    Rehab Potential  Good    PT Frequency  1x / week    PT Duration  12 weeks    PT Treatment/Interventions  Therapeutic activities;Therapeutic exercise;Patient/family education;Moist Heat;Neuromuscular re-education;Manual techniques;Stair training;Gait training;Balance training;Functional mobility training;Energy conservation;Manual lymph drainage;Scar mobilization;Biofeedback    Consulted and Agree with Plan of Care  Patient       Patient will benefit from skilled therapeutic intervention in order to improve the following deficits and impairments:  Postural dysfunction, Improper body  mechanics, Increased muscle spasms, Increased fascial restricitons, Decreased strength, Decreased endurance, Decreased range of motion, Decreased coordination, Abnormal gait, Hypomobility, Decreased activity tolerance  Visit Diagnosis: Other abnormalities of gait and mobility  Rectus diastasis  Other idiopathic scoliosis, thoracolumbar region  Leg length discrepancy  Lack of coordination     Problem List Patient Active Problem List   Diagnosis Date Noted  . HLD (hyperlipidemia) 12/15/2017  . Hypogonadism male 12/15/2017  . Bacterial overgrowth syndrome 08/25/2017  . Hypotension due to hypovolemia 08/25/2017  . Abnormal Romberg test 06/09/2017  . At risk for falls 06/09/2017  . Bladder cancer ([place]) 05/06/2017  . Advanced directives, counseling/discussion 05/03/2016  . Polycythemia 04/05/2013  . BCC (basal cell carcinoma), face 03/03/2013  . History of nonmelanoma skin cancer 01/26/2013  . 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 Posterior subcapsular polar senile cataract 07/19/2011  . Vitreous degeneration 07/19/2011  . Actinic keratosis 01/24/2011  . Type II diabetes mellitus ([place]) 10/23/2010  . Fatty liver disease,  nonalcoholic 96/43/8381  . Osteopenia 02/18/2008  . Adenomatous polyp of colon 05/04/2007  . Diverticulosis of colon 04/22/2000  . Essential tremor 08/24/1996    Jerl Mina 02/20/2018, 1:20 PM  [place] MAIN [facility] SERVICES [address] [place], [place], [address] Phone: [phone]   Fax:  [phone]  Name: Jaydence Arnesen MRN: [id number] Date of Birth: 1935/03/15

## 2018-03-05 ENCOUNTER — Ambulatory Visit: Payer: Medicare Other | Admitting: Physical Therapy

## 2018-03-05 DIAGNOSIS — R279 Unspecified lack of coordination: Secondary | ICD-10-CM

## 2018-03-05 DIAGNOSIS — M2170 Unequal limb length (acquired), unspecified site: Secondary | ICD-10-CM

## 2018-03-05 DIAGNOSIS — M4125 Other idiopathic scoliosis, thoracolumbar region: Secondary | ICD-10-CM

## 2018-03-05 DIAGNOSIS — M6208 Separation of muscle (nontraumatic), other site: Secondary | ICD-10-CM

## 2018-03-05 DIAGNOSIS — R2689 Other abnormalities of gait and mobility: Secondary | ICD-10-CM | POA: Diagnosis not present

## 2018-03-05 NOTE — Therapy (Signed)
[place] MAIN [facility] SERVICES [address] [place], [place], [address] Phone: [phone]   Fax:  [phone]  Physical Therapy Treatment  Patient Details  Name: Mathew Wright MRN: [id number] Date of Birth: 02-24-1935 Referring Provider: Josephina Gip   Encounter Date: 03/05/2018  PT End of Session - 03/05/18 1703    Visit Number  2    Number of Visits  12    PT Start Time  5465    PT Stop Time  1200    PT Time Calculation (min)  55 min    Activity Tolerance  Patient tolerated treatment well    Behavior During Therapy  [facility] for tasks assessed/performed       Past Medical History:  Diagnosis Date  . Arthritis   . Cancer ([place]) 2018   bladder  . Dehydration 08/2017   was hospitalized for this at [facility]  . Diabetes mellitus without complication ([place])   . HOH (hard of hearing)    wears hearing aids  . Hypercholesteremia   . Hypertension   . Neuromuscular disorder ([facility])    tremors...takes inderal as needed  . Pneumonia    bronchitis    Past Surgical History:  Procedure Laterality Date  . CATARACT EXTRACTION W/PHACO Right 01/01/2018   Procedure: CATARACT EXTRACTION PHACO AND INTRAOCULAR LENS PLACEMENT (IOC);  Surgeon: Eulogio Bear, MD;  Location: [facility] ORS;  Service: Ophthalmology;  Laterality: Right;  [place] 00:58.0 AP% 16.4 CDE 9.55 Fluid Pack Lot # [id number]  . COLONOSCOPY      There were no vitals filed for this visit.  Subjective Assessment - 03/05/18 1109    Subjective  Pt reports no problems with his exercises     Pertinent History  Loaded activities: 5 x days / week at gym for 6-7 years: Treadmill 20 min, weight machines for UE/LE , does not have a stretching routine.  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 Has help with an assistance who comes to the hours.  Difficulty feeling feet due to  peripheral neuropathy for the past 4 years.  Pt feels he can lose his balance and staggers when walking       Patient Stated Goals  no messing up his clothes         [facility] PT Assessment - 03/05/18 1139      Coordination   Gross Motor Movements are Fluid and Coordinated  -- increased diaphramgatic excursion. depression       Palpation   Spinal mobility  Significantly increased paraspinal, medial scap mm tensions B, limited mobility at posterior intercostals , limited trunk rotation B                 Pelvic Floor Special Questions - 03/05/18 1140    Diastasis Recti  less depth post Tx today        [place] Adult PT Treatment/Exercise - 03/05/18 1140      Neuro Re-ed    Neuro Re-ed Details   see pt instruction      Manual Therapy   Manual therapy comments  STM, MWM at problem areas                   PT Long Term Goals - 02/19/18 1129      PT LONG TERM GOAL #1   Title  Pt will  decrease the soiling of his pants from 2x/ day to < 1 x day in order to leave the house    Time  8    Period  Weeks    Status  New    Target Date  04/16/18      PT LONG TERM GOAL #2   Title  Pt will demo proper body mechanics with weight lifting and assistance with transfering his wife to minimize loading onhis body and to minimize Sx and injuries     Time  4    Period  Weeks    Status  New    Target Date  03/19/18      PT LONG TERM GOAL #3   Title  Pt will demo no gait deviations, more leveled shoulders, pelvis and proper sitting posture in order to maintain upright posture for optimal intraabdominal pressure system to progress to pelvic/abdominal exercises     Time  6    Period  Weeks    Status  New    Target Date  04/03/18      PT LONG TERM GOAL #4   Title  Pt will decrease his COREFO score from % to < % inorder to improve bowel function    Time  12    Period  Weeks    Status  New    Target Date  05/15/18      PT LONG TERM GOAL #5   Title  Pt will increase his gait  speed from 0.74 m/s  to > 1.2 m/s in order to ambulate safely with less risk for falls in the community    Time  8    Period  Weeks    Status  New    Target Date  04/17/18            Plan - 03/05/18 1703    Clinical Impression Statement  Addressed pt's forward head, increased thoracic kyphosis, and diastasis recti with manual Tx which pt tolerated without complaints. Pt showed improvements in these areas. Initiated deep core strengthening. Anticipate these improvements will contribute to a better functioning intraabdominal pressure system for continence/postural staibility. Pt continues to benefit from skilled PT.     Rehab Potential  Good    PT Frequency  1x / week    PT Duration  12 weeks    PT Treatment/Interventions  Therapeutic activities;Therapeutic exercise;Patient/family education;Moist Heat;Neuromuscular re-education;Manual techniques;Stair training;Gait training;Balance training;Functional mobility training;Energy conservation;Manual lymph drainage;Scar mobilization;Biofeedback    Consulted and Agree with Plan of Care  Patient       Patient will benefit from skilled therapeutic intervention in order to improve the following deficits and impairments:  Postural dysfunction, Improper body mechanics, Increased muscle spasms, Increased fascial restricitons, Decreased strength, Decreased endurance, Decreased range of motion, Decreased coordination, Abnormal gait, Hypomobility, Decreased activity tolerance  Visit Diagnosis: Rectus diastasis  Other idiopathic scoliosis, thoracolumbar region  Leg length discrepancy  Other abnormalities of gait and mobility  Lack of coordination     Problem List Patient Active Problem List   Diagnosis Date Noted  . HLD (hyperlipidemia) 12/15/2017  . Hypogonadism male 12/15/2017  . Bacterial overgrowth syndrome 08/25/2017  . Hypotension due to hypovolemia 08/25/2017  . Abnormal Romberg test 06/09/2017  . At risk for falls 06/09/2017  .  Bladder cancer ([place]) 05/06/2017  . Advanced directives, counseling/discussion 05/03/2016  . Polycythemia 04/05/2013  . BCC (basal cell carcinoma), face 03/03/2013  . History of nonmelanoma skin cancer 01/26/2013  . Borderline glaucoma with ocular  hypertension 07/19/2011  . Posterior subcapsular polar senile cataract 07/19/2011  . Vitreous degeneration 07/19/2011  . Actinic keratosis 01/24/2011  . Type II diabetes mellitus ([place]) 10/23/2010  . Fatty liver disease, nonalcoholic 59/92/3414  . Osteopenia 02/18/2008  . Adenomatous polyp of colon 05/04/2007  . Diverticulosis of colon 04/22/2000  . Essential tremor 08/24/1996    Jerl Mina ,PT, DPT, E-RYT  03/05/2018, 5:13 PM  [place] MAIN [facility] SERVICES [address] [place], [place], [address] Phone: [phone]   Fax:  [phone]  Name: Mathew Wright MRN: [id number] Date of Birth: 09/11/1935

## 2018-03-11 ENCOUNTER — Ambulatory Visit: Payer: Medicare Other | Admitting: Physical Therapy

## 2018-03-11 DIAGNOSIS — R279 Unspecified lack of coordination: Secondary | ICD-10-CM

## 2018-03-11 DIAGNOSIS — R2689 Other abnormalities of gait and mobility: Secondary | ICD-10-CM

## 2018-03-11 DIAGNOSIS — M6208 Separation of muscle (nontraumatic), other site: Secondary | ICD-10-CM

## 2018-03-11 DIAGNOSIS — M2170 Unequal limb length (acquired), unspecified site: Secondary | ICD-10-CM

## 2018-03-11 DIAGNOSIS — M4125 Other idiopathic scoliosis, thoracolumbar region: Secondary | ICD-10-CM

## 2018-03-11 NOTE — Patient Instructions (Signed)
Seated stretches for better posture:     Seated:   brush palm on thigh back to groin , bringing elbows back and shoulders roll back and down, feelign the chest lift, head moves back  3 reps then   Press elbows back and chin tuck , count aloud 5 sec    Repeat  brush palm on thigh back to groin , bringing elbows back and shoulders roll back and down, feelign the chest lift, head moves back  3 reps then ______   5 sets three times day

## 2018-03-12 NOTE — Therapy (Signed)
[place] MAIN [facility] SERVICES [address] [place], [place], [address] Phone: [phone]   Fax:  [phone]  Physical Therapy Treatment  Patient Details  Name: Mathew Wright MRN: [id number] Date of Birth: Mar 24, 1935 Referring Provider: Josephina Gip   Encounter Date: 03/11/2018  PT End of Session - 03/11/18 1107    Visit Number  3    Number of Visits  12    PT Start Time  1107    PT Stop Time  1200    PT Time Calculation (min)  53 min    Activity Tolerance  Patient tolerated treatment well    Behavior During Therapy  [facility] for tasks assessed/performed       Past Medical History:  Diagnosis Date  . Arthritis   . Cancer ([place]) 2018   bladder  . Dehydration 08/2017   was hospitalized for this at [facility]  . Diabetes mellitus without complication ([place])   . HOH (hard of hearing)    wears hearing aids  . Hypercholesteremia   . Hypertension   . Neuromuscular disorder (HCC)    tremors...takes inderal as needed  . Pneumonia    bronchitis    Past Surgical History:  Procedure Laterality Date  . CATARACT EXTRACTION W/PHACO Right 01/01/2018   Procedure: CATARACT EXTRACTION PHACO AND INTRAOCULAR LENS PLACEMENT (IOC);  Surgeon: Eulogio Bear, MD;  Location: [facility] ORS;  Service: Ophthalmology;  Laterality: Right;  [place] 00:58.0 AP% 16.4 CDE 9.55 Fluid Pack Lot # [id number]  . COLONOSCOPY      There were no vitals filed for this visit.  Subjective Assessment - 03/11/18 1111    Subjective  Pt reports since leaving last session, pt has experienced aching in his B neck and shoulders at 8/10 level of achiness.  Pt had to take 3 Ibprofen across the past 5 days. The achines has not gotten worse across the week. Pt notices his neck and shoulder ache gets worst at night. Pt sleeps on his L side. Pt is taking metamucil for 2 x day instead of 1 and this is helping with less leakage.      Pertinent History  Loaded activities: 5 x days / week at gym for  6-7 years: Treadmill 20 min, weight machines for UE/LE , does not have a stretching routine.  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 Has help with an assistance who comes to the hours.  Difficulty feeling feet due to peripheral neuropathy for the past 4 years.  Pt feels he can lose his balance and staggers when walking       Patient Stated Goals  no messing up his clothes         [facility] PT Assessment - 03/12/18 1603      Posture/Postural Control   Posture Comments  limited diahragmatic excursion on B ( improved post Tx)       Palpation   Spinal mobility  significant mm tensions R iliocostalis, posterior intercostalis,                    [facility] Adult PT Treatment/Exercise - 03/12/18 1603      Neuro Re-ed    Neuro Re-ed Details   see pt instruction log rolling             PT Education - 03/12/18 1605  Education provided  Yes    Education Details  HEP    Person(s) Educated  Patient    Methods  Explanation;Demonstration;Tactile cues;Verbal cues;Handout    Comprehension  Verbalized understanding;Returned demonstration          PT Long Term Goals - 02/19/18 1129      PT LONG TERM GOAL #1   Title  Pt will decrease the soiling of his pants from 2x/ day to < 1 x day in order to leave the house    Time  8    Period  Weeks    Status  New    Target Date  04/16/18      PT LONG TERM GOAL #2   Title  Pt will demo proper body mechanics with weight lifting and assistance with transfering his wife to minimize loading onhis body and to minimize Sx and injuries     Time  4    Period  Weeks    Status  New    Target Date  03/19/18      PT LONG TERM GOAL #3   Title  Pt will demo no gait deviations, more leveled shoulders, pelvis and proper sitting posture in order to maintain upright posture for optimal intraabdominal pressure system to  progress to pelvic/abdominal exercises     Time  6    Period  Weeks    Status  New    Target Date  04/03/18      PT LONG TERM GOAL #4   Title  Pt will decrease his COREFO score from % to < % inorder to improve bowel function    Time  12    Period  Weeks    Status  New    Target Date  05/15/18      PT LONG TERM GOAL #5   Title  Pt will increase his gait speed from 0.74 m/s  to > 1.2 m/s in order to ambulate safely with less risk for falls in the community    Time  8    Period  Weeks    Status  New    Target Date  04/17/18            Plan - 03/12/18 1606    Clinical Impression Statement  Pt continues to demo improved posture with less thoracic kyphosis/forward head posture but required manual Tx to address his hypomobility and his c/o of achiness that occured after last session with manual Tx in these areas. initated sitting scapular/cervical retraction isometric strengthening today. Pt demo'd his HEP correctly. Anticipate improved posture will help with intraabdominal pressure system for pelvic health. Pt continues to benefit from skilled PT.     Rehab Potential  Good    PT Frequency  1x / week    PT Duration  12 weeks    PT Treatment/Interventions  Therapeutic activities;Therapeutic exercise;Patient/family education;Moist Heat;Neuromuscular re-education;Manual techniques;Stair training;Gait training;Balance training;Functional mobility training;Energy conservation;Manual lymph drainage;Scar mobilization;Biofeedback    Consulted and Agree with Plan of Care  Patient       Patient will benefit from skilled therapeutic intervention in order to improve the following deficits and impairments:  Postural dysfunction, Improper body mechanics, Increased muscle spasms, Increased fascial restricitons, Decreased strength, Decreased endurance, Decreased range of motion, Decreased coordination, Abnormal gait, Hypomobility, Decreased activity tolerance  Visit Diagnosis: Rectus  diastasis  Other idiopathic scoliosis, thoracolumbar region  Leg length discrepancy  Other abnormalities of gait and mobility  Lack of coordination     Problem List  Patient Active Problem List   Diagnosis Date Noted  . HLD (hyperlipidemia) 12/15/2017  . Hypogonadism male 12/15/2017  . Bacterial overgrowth syndrome 08/25/2017  . Hypotension due to hypovolemia 08/25/2017  . Abnormal Romberg test 06/09/2017  . At risk for falls 06/09/2017  . Bladder cancer ([place]) 05/06/2017  . Advanced directives, counseling/discussion 05/03/2016  . Polycythemia 04/05/2013  . BCC (basal cell carcinoma), face 03/03/2013  . History of nonmelanoma skin cancer 01/26/2013  . 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 Posterior subcapsular polar senile cataract 07/19/2011  . Vitreous degeneration 07/19/2011  . Actinic keratosis 01/24/2011  . Type II diabetes mellitus ([place]) 10/23/2010  . Fatty liver disease, nonalcoholic 28/63/8177  . Osteopenia 02/18/2008  . Adenomatous polyp of colon 05/04/2007  . Diverticulosis of colon 04/22/2000  . Essential tremor 08/24/1996    Jerl Mina ,PT, DPT, E-RYT  03/12/2018, 4:10 PM  [place] [facility] MAIN [facility] SERVICES [address] [place], [place], [address] Phone: [phone]   Fax:  [phone]  Name: Derrien Anschutz MRN: [id number] Date of Birth: 18-Sep-1935

## 2018-03-16 ENCOUNTER — Encounter: Payer: Self-pay | Admitting: Podiatry

## 2018-03-16 ENCOUNTER — Ambulatory Visit (INDEPENDENT_AMBULATORY_CARE_PROVIDER_SITE_OTHER): Payer: Medicare Other | Admitting: Podiatry

## 2018-03-16 DIAGNOSIS — M79676 Pain in unspecified toe(s): Secondary | ICD-10-CM | POA: Diagnosis not present

## 2018-03-16 DIAGNOSIS — E1149 Type 2 diabetes mellitus with other diabetic neurological complication: Secondary | ICD-10-CM

## 2018-03-16 DIAGNOSIS — B351 Tinea unguium: Secondary | ICD-10-CM

## 2018-03-16 NOTE — Progress Notes (Signed)
Complaint:  Visit Type: Patient returns to my office for continued preventative foot care services. Complaint: Patient states" my nails have grown long and thick and become painful to walk and wear shoes" Patient has been diagnosed with DM with no foot complications. The patient presents for preventative foot care services. No changes to ROS.  Patient missed his last appointment due to cancer treatment.  Podiatric Exam: Vascular: dorsalis pedis and posterior tibial pulses are palpable bilateral. Capillary return is immediate. Temperature gradient is WNL. Skin turgor WNL  Sensorium: Diminished  Semmes Weinstein monofilament test. Normal tactile sensation bilaterally. Nail Exam: Pt has thick disfigured discolored nails with subungual debris noted bilateral entire nail hallux through fifth toenails Ulcer Exam: There is no evidence of ulcer or pre-ulcerative changes or infection. Orthopedic Exam: Muscle tone and strength are WNL. No limitations in general ROM. No crepitus or effusions noted. Foot type and digits show no abnormalities. Bony prominences are unremarkable. Skin: No Porokeratosis. No infection or ulcers  Diagnosis:  Onychomycosis, , Pain in right toe, pain in left toes  Treatment & Plan Procedures and Treatment: Consent by patient was obtained for treatment procedures. The patient understood the discussion of treatment and procedures well. All questions were answered thoroughly reviewed. Debridement of mycotic and hypertrophic toenails, 1 through 5 bilateral and clearing of subungual debris. No ulceration, no infection noted. ABN signed for 2019. Return Visit-Office Procedure: Patient instructed to return to the office for a follow up visit 3 months for continued evaluation and treatment.    Frances Ambrosino DPM 

## 2018-03-19 ENCOUNTER — Ambulatory Visit: Payer: Medicare Other | Attending: Gastroenterology | Admitting: Physical Therapy

## 2018-03-19 DIAGNOSIS — R279 Unspecified lack of coordination: Secondary | ICD-10-CM | POA: Diagnosis present

## 2018-03-19 DIAGNOSIS — M6208 Separation of muscle (nontraumatic), other site: Secondary | ICD-10-CM | POA: Diagnosis not present

## 2018-03-19 DIAGNOSIS — M4125 Other idiopathic scoliosis, thoracolumbar region: Secondary | ICD-10-CM | POA: Insufficient documentation

## 2018-03-19 DIAGNOSIS — M2170 Unequal limb length (acquired), unspecified site: Secondary | ICD-10-CM | POA: Insufficient documentation

## 2018-03-19 DIAGNOSIS — R278 Other lack of coordination: Secondary | ICD-10-CM | POA: Diagnosis not present

## 2018-03-19 DIAGNOSIS — R2689 Other abnormalities of gait and mobility: Secondary | ICD-10-CM | POA: Diagnosis present

## 2018-03-19 NOTE — Therapy (Signed)
[place] MAIN [facility] SERVICES [address] [place], [place], [address] Phone: [phone]   Fax:  [phone]  Physical Therapy Treatment  Patient Details  Name: Mathew Wright MRN: [id number] Date of Birth: 26-Aug-1935 Referring Provider: Josephina Gip   Encounter Date: 03/19/2018  PT End of Session - 03/19/18 1110    Visit Number  4    Number of Visits  12    PT Start Time  1107    PT Stop Time  1150    PT Time Calculation (min)  43 min    Activity Tolerance  Patient tolerated treatment well    Behavior During Therapy  [facility] for tasks assessed/performed       Past Medical History:  Diagnosis Date  . Arthritis   . Cancer ([place]) 2018   bladder  . Dehydration 08/2017   was hospitalized for this at [facility]  . Diabetes mellitus without complication ([place])   . HOH (hard of hearing)    wears hearing aids  . Hypercholesteremia   . Hypertension   . Neuromuscular disorder (HCC)    tremors...takes inderal as needed  . Pneumonia    bronchitis    Past Surgical History:  Procedure Laterality Date  . CATARACT EXTRACTION W/PHACO Right 01/01/2018   Procedure: CATARACT EXTRACTION PHACO AND INTRAOCULAR LENS PLACEMENT (IOC);  Surgeon: Eulogio Bear, MD;  Location: [facility] ORS;  Service: Ophthalmology;  Laterality: Right;  [place] 00:58.0 AP% 16.4 CDE 9.55 Fluid Pack Lot # [id number]  . COLONOSCOPY      There were no vitals filed for this visit.  Subjective Assessment - 03/19/18 1108    Subjective  Pt reports that taking 2 doses of Metamucil  in the morning and again at night is working well for his fecal incontinence.  HIs MD recommended 3 doses but pt feels 2 doses works very well. Pt has no complaints today      Pertinent History  Loaded activities: 5 x days / week at gym for 6-7 years: Treadmill 20 min, weight machines for UE/LE , does not have a stretching routine.  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 Has help with an assistance who comes to the hours.  Difficulty feeling feet due to peripheral neuropathy for the past 4 years.  Pt feels he can lose his balance and staggers when walking       Patient Stated Goals  no messing up his clothes         [facility] PT Assessment - 03/19/18 1110      Coordination   Gross Motor Movements are Fluid and Coordinated  -- significantly increased lateral excursion diaphragm post Tx       Posture/Postural Control   Posture Comments  improved upright posture, signifcantly less forward head       Palpation   Spinal mobility  limited posterior rotation in thoracic. ribs  hypomobility at B T10-L1 . increased tensions medial to scapula B                    [facility] Adult PT Treatment/Exercise - 03/19/18 1110      Modalities   Modalities  -- 5 min at heat pack back, not billed       Manual Therapy   Manual therapy comments  STM at problem areas with Grade III  at T10-L1              PT Education - 03/19/18 1212    Education provided  Yes    Education Details  HEP    Person(s) Educated  Patient    Methods  Explanation;Demonstration;Tactile cues;Verbal cues;Handout    Comprehension  Returned demonstration;Verbalized understanding          PT Long Term Goals - 02/19/18 1129      PT LONG TERM GOAL #1   Title  Pt will decrease the soiling of his pants from 2x/ day to < 1 x day in order to leave the house    Time  8    Period  Weeks    Status  New    Target Date  04/16/18      PT LONG TERM GOAL #2   Title  Pt will demo proper body mechanics with weight lifting and assistance with transfering his wife to minimize loading onhis body and to minimize Sx and injuries     Time  4    Period  Weeks    Status  New    Target Date  03/19/18      PT LONG TERM GOAL #3   Title  Pt will demo no gait deviations, more leveled shoulders, pelvis  and proper sitting posture in order to maintain upright posture for optimal intraabdominal pressure system to progress to pelvic/abdominal exercises     Time  6    Period  Weeks    Status  New    Target Date  04/03/18      PT LONG TERM GOAL #4   Title  Pt will decrease his COREFO score from % to < % inorder to improve bowel function    Time  12    Period  Weeks    Status  New    Target Date  05/15/18      PT LONG TERM GOAL #5   Title  Pt will increase his gait speed from 0.74 m/s  to > 1.2 m/s in order to ambulate safely with less risk for falls in the community    Time  8    Period  Weeks    Status  New    Target Date  04/17/18            Plan - 03/19/18 1155    Clinical Impression Statement  Pt has made signficant improvement with improved upright posture but requried more manual Tx to elicit more posterior rotation of thoracic and ribs and lateral excursion if his diaphragm. Pt tolerated manual Tx without complaints. Pt will be ready for pelvic floor training at next visit as pt will yield better outcome with signficantly less slumped posture and less load on his pelvic floor. Pt's abdominal wall is also getting stronger but diastasis recti still remains. Pt continues to benefit from skilled PT.     Rehab Potential  Good    PT Frequency  1x / week    PT Duration  12 weeks    PT Treatment/Interventions  Therapeutic activities;Therapeutic exercise;Patient/family education;Moist Heat;Neuromuscular re-education;Manual techniques;Stair training;Gait training;Balance training;Functional mobility training;Energy conservation;Manual lymph drainage;Scar mobilization;Biofeedback    Consulted and Agree with Plan of Care  Patient       Patient will benefit from skilled therapeutic intervention in order to improve the following deficits and impairments:  Postural dysfunction, Improper body mechanics, Increased muscle spasms, Increased fascial restricitons, Decreased strength, Decreased  endurance, Decreased range of motion, Decreased  coordination, Abnormal gait, Hypomobility, Decreased activity tolerance  Visit Diagnosis: Rectus diastasis  Other idiopathic scoliosis, thoracolumbar region  Leg length discrepancy  Other abnormalities of gait and mobility  Lack of coordination     Problem List Patient Active Problem List   Diagnosis Date Noted  . HLD (hyperlipidemia) 12/15/2017  . Hypogonadism male 12/15/2017  . Bacterial overgrowth syndrome 08/25/2017  . Hypotension due to hypovolemia 08/25/2017  . Abnormal Romberg test 06/09/2017  . At risk for falls 06/09/2017  . Bladder cancer ([place]) 05/06/2017  . Advanced directives, counseling/discussion 05/03/2016  . Polycythemia 04/05/2013  . BCC (basal cell carcinoma), face 03/03/2013  . History of nonmelanoma skin cancer 01/26/2013  . 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 Posterior subcapsular polar senile cataract 07/19/2011  . Vitreous degeneration 07/19/2011  . Actinic keratosis 01/24/2011  . Type II diabetes mellitus ([place]) 10/23/2010  . Fatty liver disease, nonalcoholic 16/08/9603  . Osteopenia 02/18/2008  . Adenomatous polyp of colon 05/04/2007  . Diverticulosis of colon 04/22/2000  . Essential tremor 08/24/1996    Jerl Mina ,PT, DPT, E-RYT  03/19/2018, 12:13 PM  [place] MAIN [facility] SERVICES [address] [place], [place], [address] Phone: [phone]   Fax:  [phone]  Name: Mathew Wright MRN: [id number] Date of Birth: 1935-09-26

## 2018-03-19 NOTE — Patient Instructions (Signed)
To continue improving posture in order to increase intraabdominal pressure for pelvic floor training later   Hands on the bench, by side of hips, pressing down Squeeze shoulders back and down  Chest lifts, chin up slight Down  Repeat 10 x 3 x x 3  \

## 2018-04-01 ENCOUNTER — Ambulatory Visit: Payer: Medicare Other | Admitting: Physical Therapy

## 2018-04-01 DIAGNOSIS — M4125 Other idiopathic scoliosis, thoracolumbar region: Secondary | ICD-10-CM

## 2018-04-01 DIAGNOSIS — M6208 Separation of muscle (nontraumatic), other site: Secondary | ICD-10-CM

## 2018-04-01 DIAGNOSIS — R279 Unspecified lack of coordination: Secondary | ICD-10-CM

## 2018-04-01 DIAGNOSIS — M2170 Unequal limb length (acquired), unspecified site: Secondary | ICD-10-CM

## 2018-04-01 DIAGNOSIS — R2689 Other abnormalities of gait and mobility: Secondary | ICD-10-CM

## 2018-04-01 NOTE — Patient Instructions (Addendum)
PELVIC FLOOR / KEGEL EXERCISES   Pelvic floor/ Kegel exercises are used to strengthen the muscles in the base of your pelvis that are responsible for supporting your pelvic organs and preventing urine/feces leakage. Based on your therapist's recommendations, they can be performed while standing, sitting, or lying down.  Make yourself aware of this muscle group by using these cues:  Imagine you are in a crowded room and you feel the need to pass gas. Your response is to pull up and in at the rectum.  Close the rectum. Pull the muscles up inside your body,feeling your scrotum lifting as well . Feel the pelvic floor muscles lift as if you were walking into a cold lake.  Place your hand on top of your pubic bone. Tighten and draw in the muscles around the anal muscles without squeezing the buttock muscles.  Common Errors:  Breath holding: If you are holding your breath, you may be bearing down against your bladder instead of pulling it up. If you belly bulges up while you are squeezing, you are holding your breath. Be sure to breathe gently in and out while exercising. Counting out loud may help you avoid holding your breath.  Accessory muscle use: You should not see or feel other muscle movement when performing pelvic floor exercises. When done properly, no one can tell that you are performing the exercises. Keep the buttocks, belly and inner thighs relaxed.  Overdoing it: Your muscles can fatigue and stop working for you if you over-exercise. You may actually leak more or feel soreness at the lower abdomen or rectum.  YOUR HOME EXERCISE PROGRAM     SHORT HOLDS: Position: on  sitting   Inhale and then exhale. Then squeeze the muscle.  (Be sure to let belly sink in with exhales and not push outward)  Perform 10 repetitions, 5  Times/day  **ALSO SQUEEZE BEFORE YOUR SNEEZE, COUGH, LAUGH to decrease downward pressure   **ALSO EXHALE BEFORE YOU RISE AGAINST GRAVITY (lifting, sit to stand,  from squat to stand)    ______   Belly massage  R lower belly to Upper then L upper to L lower belly 5 x

## 2018-04-02 NOTE — Therapy (Signed)
[place] MAIN [facility] SERVICES [address] [place], [place], [address] Phone: [phone]   Fax:  [phone]  Physical Therapy Treatment  Patient Details  Name: Mathew Wright MRN: [id number] Date of Birth: April 23, 1935 Referring Provider: Josephina Gip   Encounter Date: 04/01/2018  PT End of Session - 04/01/18 1143    Visit Number  5    Number of Visits  12    PT Start Time  1110    PT Stop Time  1155    PT Time Calculation (min)  45 min    Activity Tolerance  Patient tolerated treatment well    Behavior During Therapy  [facility] for tasks assessed/performed       Past Medical History:  Diagnosis Date  . Arthritis   . Cancer ([place]) 2018   bladder  . Dehydration 08/2017   was hospitalized for this at [facility]  . Diabetes mellitus without complication ([place])   . HOH (hard of hearing)    wears hearing aids  . Hypercholesteremia   . Hypertension   . Neuromuscular disorder (HCC)    tremors...takes inderal as needed  . Pneumonia    bronchitis    Past Surgical History:  Procedure Laterality Date  . CATARACT EXTRACTION W/PHACO Right 01/01/2018   Procedure: CATARACT EXTRACTION PHACO AND INTRAOCULAR LENS PLACEMENT (IOC);  Surgeon: Eulogio Bear, MD;  Location: [facility] ORS;  Service: Ophthalmology;  Laterality: Right;  [place] 00:58.0 AP% 16.4 CDE 9.55 Fluid Pack Lot # [id number]  . COLONOSCOPY      There were no vitals filed for this visit.  Subjective Assessment - 04/01/18 1114    Subjective  Pt skipped taking Metamucil one day, he noticed fecal incontinence is a problem. When he goes back on Metamucil, his leakage is better . Pt is concerned about his balance with nerve damage due to diabetes.     Pertinent History  Loaded activities: 5 x days / week at gym for 6-7 years: Treadmill 20 min, weight machines for UE/LE , does not have a stretching routine.  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 Has help with an assistance who comes to the hours.  Difficulty feeling feet due to peripheral neuropathy for the past 4 years.  Pt feels he can lose his balance and staggers when walking       Patient Stated Goals  no messing up his clothes                    Pelvic Floor Special Questions - 04/02/18 0002    Pelvic Floor Internal Exam  pt consented verbally withoutcontraindications    Exam Type  Rectal    Palpation  increased tensions at puborectalis 6 o clock, dyscoordination of pelvic floor mm     Strength  good squeeze, good lift, able to hold agaisnt strong resistance post Tx, sequential and circumferential        [facility] Adult PT Treatment/Exercise - 04/02/18 0004      Neuro Re-ed    Neuro Re-ed Details   see pt instruction log rolling      Manual Therapy   Internal Pelvic Floor  fascial release with coordinated breathing              PT Education - 04/01/18 1142    Education provided  Yes  Education Details  HEP    Person(s) Educated  Patient    Methods  Explanation;Demonstration;Tactile cues;Verbal cues;Handout    Comprehension  Returned demonstration;Verbalized understanding;Verbal cues required;Tactile cues required          PT Long Term Goals - 02/19/18 1129      PT LONG TERM GOAL #1   Title  Pt will decrease the soiling of his pants from 2x/ day to < 1 x day in order to leave the house    Time  8    Period  Weeks    Status  New    Target Date  04/16/18      PT LONG TERM GOAL #2   Title  Pt will demo proper body mechanics with weight lifting and assistance with transfering his wife to minimize loading onhis body and to minimize Sx and injuries     Time  4    Period  Weeks    Status  New    Target Date  03/19/18      PT LONG TERM GOAL #3   Title  Pt will demo no gait deviations, more leveled shoulders, pelvis and proper sitting posture in order to maintain  upright posture for optimal intraabdominal pressure system to progress to pelvic/abdominal exercises     Time  6    Period  Weeks    Status  New    Target Date  04/03/18      PT LONG TERM GOAL #4   Title  Pt will decrease his COREFO score from % to < % inorder to improve bowel function    Time  12    Period  Weeks    Status  New    Target Date  05/15/18      PT LONG TERM GOAL #5   Title  Pt will increase his gait speed from 0.74 m/s  to > 1.2 m/s in order to ambulate safely with less risk for falls in the community    Time  8    Period  Weeks    Status  New    Target Date  04/17/18            Plan - 04/02/18 0004    Clinical Impression Statement Today, pt demo'd initially dyscoordination of pelvic floor through intrarectal assessment. Post Tx, pt showed proper coordination and Grade 4 contraction. Progressed to quick contractions of pelvic floor. Pt continues to show good carry over with upright posture.  Anticipate pt 's fecal leakage with improve with further Tx. Discussed nutrition in a general way with recommendation to replace bacon/ sausage with less fatty foods with more fiber ( i.e oatmeal).   Added abdominal massage to address pt's flatuence. Pt voiced concern about his balance and decreased sensation in B legs and thus sensation testing was performed. Pt showed poor sensation in B feet feet to below knee with dull and sharp testing. Discussed with pt referral for balance training in OPPT  after d/c from pelvic PT in 2 weeks and pt agreed. PT faxed order to PCP.    Pt tolerated today's Tx without complaints. Pt continues to benefits from skilled PT.       Rehab Potential  Good    PT Frequency  1x / week    PT Duration  12 weeks    PT Treatment/Interventions  Therapeutic activities;Therapeutic exercise;Patient/family education;Moist Heat;Neuromuscular re-education;Manual techniques;Stair training;Gait training;Balance training;Functional mobility training;Energy  conservation;Manual lymph drainage;Scar mobilization;Biofeedback    Consulted and Agree with  Plan of Care  Patient       Patient will benefit from skilled therapeutic intervention in order to improve the following deficits and impairments:  Postural dysfunction, Improper body mechanics, Increased muscle spasms, Increased fascial restricitons, Decreased strength, Decreased endurance, Decreased range of motion, Decreased coordination, Abnormal gait, Hypomobility, Decreased activity tolerance  Visit Diagnosis: Rectus diastasis  Other idiopathic scoliosis, thoracolumbar region  Leg length discrepancy  Other abnormalities of gait and mobility  Lack of coordination     Problem List Patient Active Problem List   Diagnosis Date Noted  . HLD (hyperlipidemia) 12/15/2017  . Hypogonadism male 12/15/2017  . Bacterial overgrowth syndrome 08/25/2017  . Hypotension due to hypovolemia 08/25/2017  . Abnormal Romberg test 06/09/2017  . At risk for falls 06/09/2017  . Bladder cancer ([place]) 05/06/2017  . Advanced directives, counseling/discussion 05/03/2016  . Polycythemia 04/05/2013  . BCC (basal cell carcinoma), face 03/03/2013  . History of nonmelanoma skin cancer 01/26/2013  . 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 Posterior subcapsular polar senile cataract 07/19/2011  . Vitreous degeneration 07/19/2011  . Actinic keratosis 01/24/2011  . Type II diabetes mellitus ([place]) 10/23/2010  . Fatty liver disease, nonalcoholic 77/93/9030  . Osteopenia 02/18/2008  . Adenomatous polyp of colon 05/04/2007  . Diverticulosis of colon 04/22/2000  . Essential tremor 08/24/1996    Jerl Mina ,PT, DPT, E-RYT  04/02/2018, 12:13 AM  [place] MAIN [facility] SERVICES [address] [place], [place], [address] Phone: [phone]   Fax:  [phone]  Name: Mathew Wright MRN: [id number] Date of Birth: [birth year]

## 2018-04-07 ENCOUNTER — Ambulatory Visit: Payer: Medicare Other | Admitting: Physical Therapy

## 2018-04-07 DIAGNOSIS — R2689 Other abnormalities of gait and mobility: Secondary | ICD-10-CM

## 2018-04-07 DIAGNOSIS — M2170 Unequal limb length (acquired), unspecified site: Secondary | ICD-10-CM

## 2018-04-07 DIAGNOSIS — M6208 Separation of muscle (nontraumatic), other site: Secondary | ICD-10-CM

## 2018-04-07 DIAGNOSIS — M4125 Other idiopathic scoliosis, thoracolumbar region: Secondary | ICD-10-CM

## 2018-04-07 NOTE — Patient Instructions (Addendum)
PELVIC FLOOR / KEGEL EXERCISES   Pelvic floor/ Kegel exercises are used to strengthen the muscles in the base of your pelvis that are responsible for supporting your pelvic organs and preventing urine/feces leakage. Based on your therapist's recommendations, they can be performed while standing, sitting, or lying down.  Make yourself aware of this muscle group by using these cues:  Imagine you are in a crowded room and you feel the need to pass gas. Your response is to pull up and in at the rectum.  Close the rectum. Pull the muscles up inside your body,feeling your scrotum lifting as well . Feel the pelvic floor muscles lift as if you were walking into a cold lake.  Place your hand on top of your pubic bone. Tighten and draw in the muscles around the anal muscles without squeezing the buttock muscles.  Common Errors:  Breath holding: If you are holding your breath, you may be bearing down against your bladder instead of pulling it up. If you belly bulges up while you are squeezing, you are holding your breath. Be sure to breathe gently in and out while exercising. Counting out loud may help you avoid holding your breath.  Accessory muscle use: You should not see or feel other muscle movement when performing pelvic floor exercises. When done properly, no one can tell that you are performing the exercises. Keep the buttocks, belly and inner thighs relaxed.  Overdoing it: Your muscles can fatigue and stop working for you if you over-exercise. You may actually leak more or feel soreness at the lower abdomen or rectum.  YOUR HOME EXERCISE PROGRAM  LONG HOLDS: Position: on sitting  Inhale and then exhale. Then squeeze the muscle and count aloud for 5 seconds. Rest with three long breaths. (Be sure to let belly sink in with exhales and not push outward)  Perform 5 repetitions, 3 times/day  SHORT HOLDS: Position: on back, sitting   Inhale and then exhale. Then squeeze the muscle.  (Be sure to  let belly sink in with exhales and not push outward)  Perform 5 repetitions, 5  Times/day  **ALSO SQUEEZE BEFORE YOUR SNEEZE, COUGH, LAUGH to decrease downward pressure   **ALSO EXHALE BEFORE YOU RISE AGAINST GRAVITY (lifting, sit to stand, from squat to stand)   _________  .band at doorknob  "tug a war" practice without moving the front knee Weight shift hips back  This is the position to assist with your wife instead of standing with feet next to each other

## 2018-04-07 NOTE — Therapy (Signed)
[place] MAIN [facility] SERVICES [address] [place], [place], [address] Phone: [phone]   Fax:  [phone]  Physical Therapy Treatment / Discharge Summary   Patient Details  Name: Mathew Wright MRN: [id number] Date of Birth: [birth year] Referring Provider: Josephina Gip   Encounter Date: 04/07/2018  PT End of Session - 04/07/18 5638    Visit Number  6    Number of Visits  12    PT Start Time  0807    PT Stop Time  0849    PT Time Calculation (min)  42 min    Activity Tolerance  Patient tolerated treatment well    Behavior During Therapy  [facility] for tasks assessed/performed       Past Medical History:  Diagnosis Date  . Arthritis   . Cancer ([place]) 2018   bladder  . Dehydration 08/2017   was hospitalized for this at [facility]  . Diabetes mellitus without complication ([place])   . HOH (hard of hearing)    wears hearing aids  . Hypercholesteremia   . Hypertension   . Neuromuscular disorder (HCC)    tremors...takes inderal as needed  . Pneumonia    bronchitis    Past Surgical History:  Procedure Laterality Date  . CATARACT EXTRACTION W/PHACO Right 01/01/2018   Procedure: CATARACT EXTRACTION PHACO AND INTRAOCULAR LENS PLACEMENT (IOC);  Surgeon: Eulogio Bear, MD;  Location: [facility] ORS;  Service: Ophthalmology;  Laterality: Right;  [place] 00:58.0 AP% 16.4 CDE 9.55 Fluid Pack Lot # [id number]  . COLONOSCOPY      There were no vitals filed for this visit.  Subjective Assessment - 04/07/18 0823    Subjective  Pt had diarrhea this week and he thinks it may be due eating pizza. The previous week , his bowel went real good. He was very impressed.     Pertinent History  Loaded activities: 5 x days / week at gym for 6-7 years: Treadmill 20 min, weight machines for UE/LE , does not have a stretching routine.  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 Has help with an assistance who comes to the hours.  Difficulty feeling feet due to peripheral neuropathy for the past 4 years.  Pt feels he can lose his balance and staggers when walking       Patient Stated Goals  no messing up his clothes                    Pelvic Floor Special Questions - 04/07/18 0824    External Perineal Exam       Pelvic Floor Internal Exam  pt declined reassessment for intrarectal exam        Seated position:  demo'd 5 sec holds, x 5 reps    Simualted position when pt assists with wife to toilet:  Pt in tandem stance with posterior lean and excessive overuse of upper body, poor postural stability.  Improved postural stabilty with lunge stance and cues for posterior trunk shift   Gait speed 1.19 m/s             PT Long Term Goals - 04/07/18 7564      PT LONG TERM GOAL #1   Title  Pt will decrease the soiling of his pants from 2x/ day to < 1 x day in order to  leave the house    Time  8    Period  Weeks    Status  Achieved      PT LONG TERM GOAL #2   Title  Pt will demo proper body mechanics with weight lifting and assistance with transfering his wife to minimize loading onhis body and to minimize Sx and injuries     Time  4    Period  Weeks    Status  Achieved      PT LONG TERM GOAL #3   Title  Pt will demo no gait deviations, more leveled shoulders, pelvis and proper sitting posture in order to maintain upright posture for optimal intraabdominal pressure system to progress to pelvic/abdominal exercises     Time  6    Period  Weeks    Status  Achieved      PT LONG TERM GOAL #4   Title  Pt will decrease his COREFO score from % to < % inorder to improve bowel function    Time  12    Period  Weeks    Status  deferred because pt did not complete 2nd page at eval      PT LONG TERM GOAL #5   Title  Pt will increase his gait speed from 0.74 m/s  to > 1.2 m/s in order to ambulate safely with less  risk for falls in the community    Time  8    Period  Weeks    Status  Achieved            Plan - 04/07/18 0850    Clinical Impression Statement  Pt has achieved 100% of his goals. Pt reports his fecal incontinence Sx have improved "Moderately Better" based on GROC since his SOC. Pt reports taking Metamucil has helped his stools. Pt also demo'd improve pelvic floor coordination and has learned to strengthen his pelvic floor muscles. Pt also significantly improved his posture and demonstrates less forward head posture and also improved intraabdominal pressure system with less daistasis recti. Pt also demo'd proper body mechanics with weight lifting to minimzie straining his pelvic floor and abdominal muscles. Pt demo'd proper body mechanics with lifting/ assisting his wife with bathroom t/f. Pt is being d/c today. Pt has been referred to OPPT for balance training due to his peripheral neuropathy and fear of falling.      Rehab Potential  Good    PT Frequency  1x / week    PT Duration  12 weeks    PT Treatment/Interventions  Therapeutic activities;Therapeutic exercise;Patient/family education;Moist Heat;Neuromuscular re-education;Manual techniques;Stair training;Gait training;Balance training;Functional mobility training;Energy conservation;Manual lymph drainage;Scar mobilization;Biofeedback    Consulted and Agree with Plan of Care  Patient       Patient will benefit from skilled therapeutic intervention in order to improve the following deficits and impairments:  Postural dysfunction, Improper body mechanics, Increased muscle spasms, Increased fascial restricitons, Decreased strength, Decreased endurance, Decreased range of motion, Decreased coordination, Abnormal gait, Hypomobility, Decreased activity tolerance  Visit Diagnosis: Rectus diastasis  Other idiopathic scoliosis, thoracolumbar region  Leg length discrepancy  Other abnormalities of gait and mobility     Problem  List Patient Active Problem List   Diagnosis Date Noted  . HLD (hyperlipidemia) 12/15/2017  . Hypogonadism male 12/15/2017  . Bacterial overgrowth syndrome 08/25/2017  . Hypotension due to hypovolemia 08/25/2017  . Abnormal Romberg test 06/09/2017  . At risk for falls 06/09/2017  . Bladder cancer ([place]) 05/06/2017  . Advanced directives, counseling/discussion  05/03/2016  . Polycythemia 04/05/2013  . BCC (basal cell carcinoma), face 03/03/2013  . History of nonmelanoma skin cancer 01/26/2013  . 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 Posterior subcapsular polar senile cataract 07/19/2011  . Vitreous degeneration 07/19/2011  . Actinic keratosis 01/24/2011  . Type II diabetes mellitus ([place]) 10/23/2010  . Fatty liver disease, nonalcoholic 62/70/3500  . Osteopenia 02/18/2008  . Adenomatous polyp of colon 05/04/2007  . Diverticulosis of colon 04/22/2000  . Essential tremor 08/24/1996    Jerl Mina ,PT, DPT, E-RYT  04/07/2018, 8:59 AM  [place] MAIN [facility] SERVICES [address] [place], [place], [address] Phone: [phone]   Fax:  [phone]  Name: Rimas Gilham MRN: [id number] Date of Birth: 27-Dec-1934

## 2018-04-14 ENCOUNTER — Ambulatory Visit: Payer: Medicare Other | Admitting: Physical Therapy

## 2018-04-15 ENCOUNTER — Encounter: Payer: Medicare Other | Admitting: Physical Therapy

## 2018-04-20 ENCOUNTER — Ambulatory Visit: Payer: Medicare Other

## 2018-04-27 ENCOUNTER — Encounter: Payer: Self-pay | Admitting: Physical Therapy

## 2018-04-27 ENCOUNTER — Other Ambulatory Visit: Payer: Self-pay

## 2018-04-27 ENCOUNTER — Ambulatory Visit: Payer: Medicare Other | Attending: Internal Medicine | Admitting: Physical Therapy

## 2018-04-27 VITALS — BP 141/74

## 2018-04-27 DIAGNOSIS — R2689 Other abnormalities of gait and mobility: Secondary | ICD-10-CM | POA: Diagnosis present

## 2018-04-27 DIAGNOSIS — R2681 Unsteadiness on feet: Secondary | ICD-10-CM

## 2018-04-27 NOTE — Therapy (Signed)
[place] MAIN [facility] SERVICES [address] [place], [place], [address] Phone: [phone]   Fax:  [phone]  Physical Therapy Evaluation  Patient Details  Name: Mathew Wright MRN: [id number] Date of Birth: [birth year] Referring Provider: Lianne Moris   Encounter Date: 04/27/2018  PT End of Session - 04/27/18 1100    Visit Number  1    Number of Visits  13    Date for PT Re-Evaluation  06/08/18    Authorization Type  1/10 progress note    PT Start Time  1059    PT Stop Time  1145    PT Time Calculation (min)  46 min    Equipment Utilized During Treatment  Gait belt    Activity Tolerance  Patient tolerated treatment well    Behavior During Therapy  [facility] for tasks assessed/performed       Past Medical History:  Diagnosis Date  . Arthritis   . Cancer ([place]) 2018   bladder  . Dehydration 08/2017   was hospitalized for this at [facility]  . Diabetes mellitus without complication ([place])   . HOH (hard of hearing)    wears hearing aids  . Hypercholesteremia   . Hypertension   . Neuromuscular disorder (HCC)    tremors...takes inderal as needed  . Pneumonia    bronchitis    Past Surgical History:  Procedure Laterality Date  . CATARACT EXTRACTION W/PHACO Right 01/01/2018   Procedure: CATARACT EXTRACTION PHACO AND INTRAOCULAR LENS PLACEMENT (IOC);  Surgeon: Eulogio Bear, MD;  Location: [facility] ORS;  Service: Ophthalmology;  Laterality: Right;  [place] 00:58.0 AP% 16.4 CDE 9.55 Fluid Pack Lot # [id number]  . COLONOSCOPY      Vitals:   04/27/18 1105  BP: (!) 141/74     Subjective Assessment - 04/27/18 1107    Subjective  Pt presents with diagnosis of peripheral neuropathy with reports of imbalance    Pertinent History  Pt is a 83 y/o M who presents due to instability.  Pt reports BLE peripheral neuropathy up to knees Bil.  Pt says he can't walk in a straight line.  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  Has hearing aide in R ear.   Pt denies any falls in the past 6 months.  Pt goes to the gym ([employer]) 4 days/wk where he does the treadmill and then strengthening machines.  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  Pt has an aide coming in 5 days/wk who stays for 5 hours each day.  Husband assists wife with transfers and wife requires assist when ambulating.  Pt does the daily household chores.  Pt has someone clean the house every two weeks. Pt with h/o essential tremor.  Pt recently completed pelvic health physical therapy for fecal incontinence during which time he was taught proper body mechanics for lifting when assisting his wife.    Limitations  Walking;Lifting    How long can you sit comfortably?  n/a    How long can you stand comfortably?  1 hr    How long can you walk comfortably?  0.75 mile each morning at 85mph with ease    Currently in Pain?  No/denies         [facility] PT Assessment - 04/27/18 1111      Assessment   Medical Diagnosis  Peripheral Neuropathy    Referring Provider  Terrial Rhodes A Aleman    Onset Date/Surgical Date  04/27/16 "it has been a couple of years"    Hand Dominance  Right    Next MD Visit  6/12 with chemo MD, August 2019    Prior Therapy  Yes, pelvic health PT      Precautions   Precautions  Fall      Restrictions   Weight Bearing Restrictions  No      Balance Screen   Has the patient fallen in the past 6 months  No    Has the patient had a decrease in activity level because of a fear of falling?   No    Is the patient reluctant to leave their home because of a fear of falling?   No      Home Environment   Living Environment  Private residence    Living Arrangements  Spouse/significant other    Type of [place] to enter    Entrance Stairs-Number of Steps  [place]  One level    [place]  None      Prior Function   Level of [place]  Retired was a Neurosurgeon    Leisure   go to the gym      Cognition   Overall Cognitive Status  Within Functional Limits for tasks assessed      ROM / Strength   AROM / PROM / Strength  Strength      Strength   Overall Strength  Deficits    Strength Assessment Site  Hip;Knee;Ankle    Right/Left Hip  Right;Left    Right Hip Flexion  5/5    Right Hip External Rotation   5/5    Right Hip Internal Rotation  5/5    Right Hip ABduction  5/5    Right Hip ADduction  5/5    Left Hip Flexion  5/5    Left Hip External Rotation  5/5    Left Hip Internal Rotation  5/5    Left Hip ABduction  5/5    Left Hip ADduction  5/5    Right/Left Knee  Right;Left    Right Knee Flexion  5/5    Right Knee Extension  5/5    Left Knee Flexion  5/5    Left Knee Extension  5/5    Right/Left Ankle  Right;Left    Right Ankle Dorsiflexion  4/5    Left Ankle Dorsiflexion  4/5      Balance   Balance Assessed  Yes      Standardized Balance Assessment   Standardized Balance Assessment  Berg Balance Test;Dynamic Gait Index      Berg Balance Test   Sit to Stand  Able to stand without using hands and stabilize independently    Standing Unsupported  Able to stand safely 2 minutes    Sitting with Back Unsupported but Feet Supported on Floor or Stool  Able to sit safely and securely 2 minutes    Stand to Sit  Sits safely with minimal use of hands    Transfers  Able to transfer safely, minor use of hands    Standing Unsupported with Eyes Closed  Able to stand 10 seconds safely    Standing Ubsupported with Feet Together  Able to place feet together independently and stand 1 minute safely    From Standing, Reach Forward with Outstretched Arm  Can reach  forward >12 cm safely (5")    From Standing Position, Pick up Object from [place] to pick up shoe, needs supervision    From Standing Position, Turn to Look Behind Over each Shoulder  Needs supervision when turning    Turn 360 Degrees  Needs close supervision or verbal cueing    Standing Unsupported,  Alternately Place Feet on Step/Stool  Needs assistance to keep from falling or unable to try    Standing Unsupported, One Foot in [place] balance while stepping or standing    Standing on One Leg  Unable to try or needs assist to prevent fall    Total Score  36      Dynamic Gait Index   Level Surface  Mild Impairment    Change in Gait Speed  Mild Impairment    Gait with Horizontal Head Turns  Moderate Impairment    Gait with Vertical Head Turns  Moderate Impairment    Gait and Pivot Turn  Moderate Impairment    Step Over Obstacle  Mild Impairment    Step Around Obstacles  Severe Impairment    Steps  Mild Impairment    Total Score  11        EXAMINATION   5xSTS: 14.87 seconds   Berg Balance Test: 36/56   DGI: 11/26   Posture: Mild thoracic kyphosis with forward head posture   Gait Analysis: Pt veering L and R with slightly narrowed BOS         TREATMENT   Alternating toe taps up to 6" step with LOB x2. X20 each LE (added to HEP)   Tandem stance with intermittent UE support x30 seconds each LE (added to HEP)   Single leg balance x30 seconds each LE (added to HEP)          Objective measurements completed on examination: See above findings.              PT Education - 04/27/18 1635    Education provided  Yes    Education Details  HEP and handout; role of PT; POC    Person(s) Educated  Patient    Methods  Explanation;Demonstration;Verbal cues;Handout    Comprehension  Verbalized understanding;Returned demonstration;Verbal cues required;Need further instruction       PT Short Term Goals - 04/27/18 1627      PT SHORT TERM GOAL #1   Title  Pt will independently complete HEP at least 4 days/wk for improved carryover between sessions    Time  2    Period  Weeks    Status  New        PT Long Term Goals - 04/27/18 1627      PT LONG TERM GOAL #1   Title  Pt's Berg Balance Test score will improve to at least 48/56 to demonstrate improved  balance and decreased risk of falling    Baseline  36/56    Time  6    Period  Weeks    Status  New      PT LONG TERM GOAL #2   Title  Pt's DGI score will improve to at least 21/26 to demonstrate improved balance and decreased risk of falling    Baseline  11/26    Time  6    Period  Weeks    Status  New      PT LONG TERM GOAL #3   Title  Pt's ABC score will improve to at least 80% to demonstrate  perceived improvement in balance    Baseline  66.25%    Time  4    Period  Weeks    Status  New      PT LONG TERM GOAL #4   Title  Pt will deny feeling that he is not walking straight to demonstrate improved perception of balance (in the past week)    Baseline  pt feels that he is not walking straight but is veering to L and R    Time  4    Period  Weeks    Status  New             Plan - 04/27/18 1623    Clinical Impression Statement  Pt is a 83 y/o M who presents due to imbalance.  The pt lives a very active lifestyle and his independence and health is very important to him as he is the caregiver for his wife who has dementia.  Pt scored a 36/56 on the [employer] and a 11/26 on the DGI, indicating the pt is a greater risk of falling.  He demonstrates instability with both static and dynamic balance activities.  Pt demonstrates WNL strength BLE as tested with MMT aside from 4/5 strength in Bil DF.  Pt will benefit from skilled PT interventions for improved balance with static and dynamic activities to reduce risk of falling and injury.      History and Personal Factors relevant to plan of care:  (+) pt lives a very active lifestyle, independence is important to serve as caregiver for wife, pt goes to gym 4 days/wk with good overall strength  (-) pt more prone to injury as he is caregiver for wife     Clinical Presentation  Stable    Clinical Presentation due to:  Pt's symptoms are predictable based on peripheral neuropathy and follow a predictable pattern.  No significant changes  or fluctuation in symptoms since onset    Clinical Decision Making  Low    Rehab Potential  Good    PT Frequency  2x / week    PT Duration  6 weeks    PT Treatment/Interventions  Therapeutic activities;Therapeutic exercise;Patient/family education;Moist Heat;Neuromuscular re-education;Manual techniques;Stair training;Gait training;Balance training;Functional mobility training;Energy conservation;Biofeedback;ADLs/Self Care Home Management;Aquatic Therapy;Cryotherapy;Electrical Stimulation;Iontophoresis 4mg /ml Dexamethasone;DME Instruction;Orthotic Fit/Training;Compression bandaging;Passive range of motion;Dry needling;Taping    PT Next Visit Plan  Progress balance program    PT Home Exercise Plan  alternating toe taps to step, tandem balance, single leg balance    Recommended Other Services  none at this time    Consulted and Agree with Plan of Care  Patient       Patient will benefit from skilled therapeutic intervention in order to improve the following deficits and impairments:  Postural dysfunction, Improper body mechanics, Increased fascial restricitons, Decreased strength, Decreased endurance, Decreased range of motion, Decreased coordination, Abnormal gait, Hypomobility, Decreased activity tolerance, Decreased balance, Decreased knowledge of use of DME, Decreased safety awareness, Difficulty walking, Impaired perceived functional ability, Impaired flexibility, Impaired sensation  Visit Diagnosis: Unsteadiness on feet  Other abnormalities of gait and mobility     Problem List Patient Active Problem List   Diagnosis Date Noted  . HLD (hyperlipidemia) 12/15/2017  . Hypogonadism male 12/15/2017  . Bacterial overgrowth syndrome 08/25/2017  . Hypotension due to hypovolemia 08/25/2017  . Abnormal Romberg test 06/09/2017  . At risk for falls 06/09/2017  . Bladder cancer ([place]) 05/06/2017  . Advanced directives, counseling/discussion 05/03/2016  . Polycythemia 04/05/2013  .  BCC (basal  cell carcinoma), face 03/03/2013  . History of nonmelanoma skin cancer 01/26/2013  . 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 Posterior subcapsular polar senile cataract 07/19/2011  . Vitreous degeneration 07/19/2011  . Actinic keratosis 01/24/2011  . Type II diabetes mellitus ([place]) 10/23/2010  . Fatty liver disease, nonalcoholic 09/17/2810  . Osteopenia 02/18/2008  . Adenomatous polyp of colon 05/04/2007  . Diverticulosis of colon 04/22/2000  . Essential tremor 08/24/1996    Collie Siad PT, DPT 04/27/2018, 4:37 PM  [place] MAIN [facility] SERVICES [address] [place], [place], [address] Phone: [phone]   Fax:  [phone]  Name: Nelton Amsden MRN: [id number] Date of Birth: 08-10-1935

## 2018-04-29 ENCOUNTER — Encounter: Payer: Medicare Other | Admitting: Physical Therapy

## 2018-05-04 ENCOUNTER — Ambulatory Visit: Payer: Medicare Other | Admitting: Physical Therapy

## 2018-05-04 ENCOUNTER — Encounter: Payer: Self-pay | Admitting: Physical Therapy

## 2018-05-04 DIAGNOSIS — R2681 Unsteadiness on feet: Secondary | ICD-10-CM | POA: Diagnosis not present

## 2018-05-04 DIAGNOSIS — R2689 Other abnormalities of gait and mobility: Secondary | ICD-10-CM

## 2018-05-04 NOTE — Therapy (Signed)
[facility] SERVICES [address] [place], [place], [address] Phone: [phone]   Fax:  [phone]  Physical Therapy Treatment  Patient Details  Name: Mathew Wright MRN: [id number] Date of Birth: 27-Jun-1935 Referring Provider: Lianne Moris   Encounter Date: 05/04/2018  PT End of Session - 05/04/18 0935    Visit Number  2    Number of Visits  13    Date for PT Re-Evaluation  06/08/18    Authorization Type  2/10 progress note    PT Start Time  0933    PT Stop Time  1013    PT Time Calculation (min)  40 min    Equipment Utilized During Treatment  Gait belt    Activity Tolerance  Patient tolerated treatment well    Behavior During Therapy  [facility] for tasks assessed/performed       Past Medical History:  Diagnosis Date  . Arthritis   . Cancer ([place]) 2018   bladder  . Dehydration 08/2017   was hospitalized for this at [facility]  . Diabetes mellitus without complication ([place])   . HOH (hard of hearing)    wears hearing aids  . Hypercholesteremia   . Hypertension   . Neuromuscular disorder (HCC)    tremors...takes inderal as needed  . Pneumonia    bronchitis    Past Surgical History:  Procedure Laterality Date  . CATARACT EXTRACTION W/PHACO Right 01/01/2018   Procedure: CATARACT EXTRACTION PHACO AND INTRAOCULAR LENS PLACEMENT (IOC);  Surgeon: Eulogio Bear, MD;  Location: [facility] ORS;  Service: Ophthalmology;  Laterality: Right;  [place] 00:58.0 AP% 16.4 CDE 9.55 Fluid Pack Lot # [id number]  . COLONOSCOPY      There were no vitals filed for this visit.  Subjective Assessment - 05/04/18 0933    Subjective  Pt presents with diagnosis of peripheral neuropathy with reports of imbalance. Patient has no reports of pain today.    Pertinent History  Pt is a 83 y/o M who presents due to instability.  Pt reports BLE peripheral neuropathy up to knees Bil.  Pt says he can't walk in a straight line.  Pt is blind in L eye and cannot hear in L  ear.  Has hearing aide in R ear.  Pt denies any falls in the past 6 months.  Pt goes to the gym ([employer]) 4 days/wk where he does the treadmill and then strengthening machines.  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  Pt has an aide coming in 5 days/wk who stays for 5 hours each day.  Husband assists wife with transfers and wife requires assist when ambulating.  Pt does the daily household chores.  Pt has someone clean the house every two weeks. Pt with h/o essential tremor.  Pt recently completed pelvic health physical therapy for fecal incontinence during which time he was taught proper body mechanics for lifting when assisting his wife.    Limitations  Walking;Lifting    How long can you sit comfortably?  n/a    How long can you stand comfortably?  1 hr    How long can you walk comfortably?  0.75 mile each morning at 55mph with ease    Currently in Pain?  No/denies    Multiple Pain Sites  No        Treatment:  Standing on blue foam with head turns, trunk rotation with 3 lb rod, elbow flex ext and push outs with 3 lbs x  2 mins each  Standing with feet together and trunk rotation left and right with 3 lbs and head following x 3 mins  Standing on blue foam and tapping to 6 inch stool x 20 , steps ups to 6 inch stool x 20 and tapping fwd to stepping stones  X 2 mins with min asssit   Matrix with 17. 5 lbs fwd/ bwd stepping, side to side stepping x 3 reps  Leg  press at 130 lbs x 20 x 2 , 90 lbs with heel raises x 20 x 2  Standing heel raises x 20 , unable to perform single heel raise  CGA and Min to mod verbal cues used throughout with increased in postural sway and LOB most seen with narrow base of support and while on uneven surfaces. Continues to have balance deficits typical with diagnosis. Patient performs intermediate level exercises without pain behaviors and needs verbal cuing for postural alignment and head positioning , and correct technique.                        PT Education - 05/04/18 0934    Education provided  Yes    Education Details  HEP    Person(s) Educated  Patient    Methods  Explanation    Comprehension  Returned demonstration;Verbalized understanding       PT Short Term Goals - 04/27/18 1627      PT SHORT TERM GOAL #1   Title  Pt will independently complete HEP at least 4 days/wk for improved carryover between sessions    Time  2    Period  Weeks    Status  New        PT Long Term Goals - 04/27/18 1627      PT LONG TERM GOAL #1   Title  Pt's Berg Balance Test score will improve to at least 48/56 to demonstrate improved balance and decreased risk of falling    Baseline  36/56    Time  6    Period  Weeks    Status  New      PT LONG TERM GOAL #2   Title  Pt's DGI score will improve to at least 21/26 to demonstrate improved balance and decreased risk of falling    Baseline  11/26    Time  6    Period  Weeks    Status  New      PT LONG TERM GOAL #3   Title  Pt's ABC score will improve to at least 80% to demonstrate perceived improvement in balance    Baseline  66.25%    Time  4    Period  Weeks    Status  New      PT LONG TERM GOAL #4   Title  Pt will deny feeling that he is not walking straight to demonstrate improved perception of balance (in the past week)    Baseline  pt feels that he is not walking straight but is veering to L and R    Time  4    Period  Weeks    Status  New            Plan - 05/04/18 0935    Clinical Impression Statement  Pt was able to progress through exercises today with decreases in loss of dynamic standing during reaching activities and  single leg standing activities. Pt continues to demonstrate  decreased balance noted with dynamic activities  on even and uneven surfaces.  Pt has decreased strength and single leg stability and low back pain with twisting and supine to sit.   Pt would continue to benefit from skilled therapy services in  order to continue strengthening LE's and improving dynamic and static balance.    Rehab Potential  Good    PT Frequency  2x / week    PT Duration  6 weeks    PT Treatment/Interventions  Therapeutic activities;Therapeutic exercise;Patient/family education;Moist Heat;Neuromuscular re-education;Manual techniques;Stair training;Gait training;Balance training;Functional mobility training;Energy conservation;Biofeedback;ADLs/Self Care Home Management;Aquatic Therapy;Cryotherapy;Electrical Stimulation;Iontophoresis 4mg /ml Dexamethasone;DME Instruction;Orthotic Fit/Training;Compression bandaging;Passive range of motion;Dry needling;Taping    PT Next Visit Plan  Progress balance program    PT Home Exercise Plan  alternating toe taps to step, tandem balance, single leg balance    Consulted and Agree with Plan of Care  Patient       Patient will benefit from skilled therapeutic intervention in order to improve the following deficits and impairments:  Postural dysfunction, Improper body mechanics, Increased fascial restricitons, Decreased strength, Decreased endurance, Decreased range of motion, Decreased coordination, Abnormal gait, Hypomobility, Decreased activity tolerance, Decreased balance, Decreased knowledge of use of DME, Decreased safety awareness, Difficulty walking, Impaired perceived functional ability, Impaired flexibility, Impaired sensation  Visit Diagnosis: Unsteadiness on feet  Other abnormalities of gait and mobility     Problem List Patient Active Problem List   Diagnosis Date Noted  . HLD (hyperlipidemia) 12/15/2017  . Hypogonadism male 12/15/2017  . Bacterial overgrowth syndrome 08/25/2017  . Hypotension due to hypovolemia 08/25/2017  . Abnormal Romberg test 06/09/2017  . At risk for falls 06/09/2017  . Bladder cancer ([place]) 05/06/2017  . Advanced directives, counseling/discussion 05/03/2016  . Polycythemia 04/05/2013  . BCC (basal cell carcinoma), face 03/03/2013  . History  of nonmelanoma skin cancer 01/26/2013  . 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 Posterior subcapsular polar senile cataract 07/19/2011  . Vitreous degeneration 07/19/2011  . Actinic keratosis 01/24/2011  . Type II diabetes mellitus ([place]) 10/23/2010  . Fatty liver disease, nonalcoholic 19/14/7829  . Osteopenia 02/18/2008  . Adenomatous polyp of colon 05/04/2007  . Diverticulosis of colon 04/22/2000  . Essential tremor 08/24/1996    Alanson Puls, [place] DPT 05/04/2018, 10:17 AM  [place] [facility] SERVICES [address] [place], [place], [address] Phone: [phone]   Fax:  [phone]  Name: Mathew Wright MRN: [id number] Date of Birth: 24-Oct-1935

## 2018-05-06 ENCOUNTER — Ambulatory Visit: Payer: Medicare Other | Admitting: Physical Therapy

## 2018-05-07 ENCOUNTER — Other Ambulatory Visit: Payer: Self-pay

## 2018-05-07 ENCOUNTER — Encounter: Payer: Self-pay | Admitting: Emergency Medicine

## 2018-05-07 ENCOUNTER — Encounter: Payer: Self-pay | Admitting: Physical Therapy

## 2018-05-07 ENCOUNTER — Ambulatory Visit: Payer: Medicare Other | Admitting: Physical Therapy

## 2018-05-07 ENCOUNTER — Emergency Department
Admission: EM | Admit: 2018-05-07 | Discharge: 2018-05-07 | Disposition: A | Discharge status: Home / Self Care | Payer: Medicare Other | Attending: Emergency Medicine | Admitting: Emergency Medicine

## 2018-05-07 VITALS — BP 131/72 | HR 34

## 2018-05-07 DIAGNOSIS — Z79899 Other long term (current) drug therapy: Secondary | ICD-10-CM | POA: Diagnosis not present

## 2018-05-07 DIAGNOSIS — I10 Essential (primary) hypertension: Secondary | ICD-10-CM | POA: Insufficient documentation

## 2018-05-07 DIAGNOSIS — R2681 Unsteadiness on feet: Secondary | ICD-10-CM

## 2018-05-07 DIAGNOSIS — Z7984 Long term (current) use of oral hypoglycemic drugs: Secondary | ICD-10-CM | POA: Diagnosis not present

## 2018-05-07 DIAGNOSIS — E119 Type 2 diabetes mellitus without complications: Secondary | ICD-10-CM | POA: Insufficient documentation

## 2018-05-07 DIAGNOSIS — Z7982 Long term (current) use of aspirin: Secondary | ICD-10-CM | POA: Diagnosis not present

## 2018-05-07 DIAGNOSIS — R2689 Other abnormalities of gait and mobility: Secondary | ICD-10-CM

## 2018-05-07 DIAGNOSIS — Z87891 Personal history of nicotine dependence: Secondary | ICD-10-CM | POA: Insufficient documentation

## 2018-05-07 DIAGNOSIS — R531 Weakness: Secondary | ICD-10-CM | POA: Diagnosis present

## 2018-05-07 LAB — URINALYSIS, COMPLETE (UACMP) WITH MICROSCOPIC
Bacteria, UA: NONE SEEN
Bilirubin Urine: NEGATIVE
Glucose, UA: 50 mg/dL — AB
Hgb urine dipstick: NEGATIVE
Ketones, ur: NEGATIVE mg/dL
Leukocytes, UA: NEGATIVE
Nitrite: NEGATIVE
Protein, ur: NEGATIVE mg/dL
Specific Gravity, Urine: 1.019 (ref 1.005–1.030)
Squamous Epithelial / LPF: NONE SEEN (ref 0–5)
pH: 5 (ref 5.0–8.0)

## 2018-05-07 LAB — BASIC METABOLIC PANEL
Anion gap: 8 (ref 5–15)
BUN: 21 mg/dL — ABNORMAL HIGH (ref 6–20)
CO2: 22 mmol/L (ref 22–32)
Calcium: 8.6 mg/dL — ABNORMAL LOW (ref 8.9–10.3)
Chloride: 105 mmol/L (ref 101–111)
Creatinine, Ser: 1.05 mg/dL (ref 0.61–1.24)
GFR calc Af Amer: >60 mL/min (ref >60)
GFR calc non Af Amer: >60 mL/min (ref >60)
Glucose, Bld: 107 mg/dL — ABNORMAL HIGH (ref 65–99)
Potassium: 3.8 mmol/L (ref 3.5–5.1)
Sodium: 135 mmol/L (ref 135–145)

## 2018-05-07 LAB — CBC
HCT: 44.8 % (ref 40.0–52.0)
Hemoglobin: 15.4 g/dL (ref 13.0–18.0)
MCH: 31.6 pg (ref 26.0–34.0)
MCHC: 34.3 g/dL (ref 32.0–36.0)
MCV: 92.1 fL (ref 80.0–100.0)
Platelets: 196 10*3/uL (ref 150–440)
RBC: 4.87 MIL/uL (ref 4.40–5.90)
RDW: 14.4 % (ref 11.5–14.5)
WBC: 7 10*3/uL (ref 3.8–10.6)

## 2018-05-07 LAB — TROPONIN I: Troponin I: <0.03 ng/mL (ref <0.03)

## 2018-05-07 NOTE — ED Notes (Signed)
Patient currently walking around ED lobby eating chips.  Patient has steady gait at this time.

## 2018-05-07 NOTE — ED Triage Notes (Signed)
Pt comes into the ED via POV c/o weakness that started today. Patient states he was in PT having his evaluation when his pulse started going up and down with them and he explained that he was more tired today than normal.  Patient denies any h/o atrial fibrillation.  Denies any dizziness, shortness of breath, or chest pain.  Patient neurologically intact and in NAD at this time.  Patient has no other complaints other than being more tired than his usual.

## 2018-05-07 NOTE — ED Notes (Signed)
Pt report he needs to get home to meet someone there and does not want second troponin to be drawn. Pt standing in doorway and walking around unit with steady gait.

## 2018-05-07 NOTE — ED Provider Notes (Signed)
[facility] Emergency Department Provider Note  Time seen: 1:13 PM  I have reviewed the triage vital signs and the nursing notes.   HISTORY  Chief Complaint Weakness    HPI Mathew Wright is a 83 y.o. male with a past medical history of diabetes, hypertension, hyperlipidemia, presents to the emergency department for weakness.  According to the patient he was at physical therapy today where he goes for vestibular therapy.  States he was walking and began feeling what he describes as weak.  Denies any chest pain or palpitations.  Denies abdominal pain, nausea vomiting or diarrhea.  Denies recent dysuria or hematuria.  States overall he feels normal now, states when they were checking his vitals that they noticed his heart rate went up and down a few times patient denies feeling this, but they recommended he go to the ER for evaluation.   Past Medical History:  Diagnosis Date  . Arthritis   . Cancer ([place]) 2018   bladder  . Dehydration 08/2017   was hospitalized for this at [facility]  . Diabetes mellitus without complication ([place])   . HOH (hard of hearing)    wears hearing aids  . Hypercholesteremia   . Hypertension   . Neuromuscular disorder (HCC)    tremors...takes inderal as needed  . Pneumonia    bronchitis    Patient Active Problem List   Diagnosis Date Noted  . HLD (hyperlipidemia) 12/15/2017  . Hypogonadism male 12/15/2017  . Bacterial overgrowth syndrome 08/25/2017  . Hypotension due to hypovolemia 08/25/2017  . Abnormal Romberg test 06/09/2017  . At risk for falls 06/09/2017  . Bladder cancer ([place]) 05/06/2017  . Advanced directives, counseling/discussion 05/03/2016  . Polycythemia 04/05/2013  . BCC (basal cell carcinoma), face 03/03/2013  . History of nonmelanoma skin cancer 01/26/2013  . 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 Posterior subcapsular polar senile cataract 07/19/2011  . Vitreous degeneration 07/19/2011  .  Actinic keratosis 01/24/2011  . Type II diabetes mellitus ([place]) 10/23/2010  . Fatty liver disease, nonalcoholic 16/08/9603  . Osteopenia 02/18/2008  . Adenomatous polyp of colon 05/04/2007  . Diverticulosis of colon 04/22/2000  . Essential tremor 08/24/1996    Past Surgical History:  Procedure Laterality Date  . CATARACT EXTRACTION W/PHACO Right 01/01/2018   Procedure: CATARACT EXTRACTION PHACO AND INTRAOCULAR LENS PLACEMENT (IOC);  Surgeon: Eulogio Bear, MD;  Location: [facility] ORS;  Service: Ophthalmology;  Laterality: Right;  [place] 00:58.0 AP% 16.4 CDE 9.55 Fluid Pack Lot # [id number]  . COLONOSCOPY      Prior to Admission medications   Medication Sig Start Date End Date Taking? Authorizing Provider  amLODipine (NORVASC) 10 MG tablet Take 10 mg by mouth daily.     [provider]  aspirin 81 MG tablet Take 81 mg by mouth daily.    [provider]  cephALEXin (KEFLEX) 500 MG capsule Take 1 capsule (500 mg total) by mouth 3 (three) times daily. Patient not taking: Reported on 02/19/2018 12/23/17   Nance Pear, MD  dorzolamide-timolol (COSOPT) 22.3-6.8 MG/ML ophthalmic solution Place 1 drop into both eyes 2 (two) times daily.  10/18/13   [provider]  enalapril (VASOTEC) 10 MG tablet Take 10 mg by mouth 2 (two) times daily.  12/20/13   [provider]  finasteride (PROSCAR) 5 MG tablet Take 5 mg by mouth daily.  02/18/14   [provider]  latanoprost (XALATAN) 0.005 % ophthalmic solution Place 1 drop in right eye at bedtime  [provider]  metFORMIN (GLUCOPHAGE) 500 MG tablet Take by mouth 2 (two) times daily with a meal.    [provider]  metoprolol succinate (TOPROL-XL) 100 MG 24 hr tablet Take 100 mg by mouth daily.  12/20/13   [provider]  Multiple Vitamin (MULTIVITAMIN) capsule Take 1 capsule by mouth daily.    [provider]  Multiple Vitamins-Minerals (CENTRUM SILVER PO) Take 1 tablet by mouth  daily.     [provider]  primidone (MYSOLINE) 50 MG tablet Take 100-150 mg by mouth See admin instructions. Take 100 mg by mouth in the morning and take 150 mg by mouth at bedtime 12/21/13   [provider]  propranolol (INDERAL) 10 MG tablet Take 10-20 mg by mouth 2 (two) times daily as needed (for tremors).  11/19/13   [provider]  simvastatin (ZOCOR) 20 MG tablet Take 20 mg by mouth at bedtime.  10/27/13   [provider]  tamsulosin (FLOMAX) 0.4 MG CAPS capsule Take 0.8 mg by mouth daily.  12/02/17   [provider]  Testosterone (TESTOPEL) 75 MG PLLT Place 1 implant in skin every 4 months 11/05/11   [provider]    Allergies  Allergen Reactions  . Shellfish Allergy Nausea And Vomiting  . Fish-Derived Products Nausea And Vomiting    Family History  Problem Relation Age of Onset  . Vision loss Mother   . Stroke Mother   . Aneurysm Father     Social History Social History   Tobacco Use  . Smoking status: Former Research scientist (life sciences)  . Smokeless tobacco: Never Used  . Tobacco comment: quit  Substance Use Topics  . Alcohol use: No  . Drug use: No    Review of Systems Constitutional: Negative for fever.  Mild weakness earlier today, now resolved Eyes: Negative for visual complaints ENT: Negative for recent illness/congestion Cardiovascular: Negative for chest pain. Respiratory: Negative for shortness of breath. Gastrointestinal: Negative for abdominal pain, vomiting and diarrhea. Genitourinary: Negative for dysuria or hematuria Musculoskeletal: Negative for musculoskeletal complaints Skin: Negative for skin complaints  Neurological: Negative for headache All other ROS negative  ____________________________________________   PHYSICAL EXAM:  VITAL SIGNS: ED Triage Vitals  Enc Vitals Group     BP 05/07/18 1141 126/63     Pulse Rate 05/07/18 1141 77     Resp 05/07/18 1141 18     Temp 05/07/18 1141 97.9 F (36.6 C)      Temp Source 05/07/18 1141 Oral     SpO2 05/07/18 1141 97 %     Weight 05/07/18 1142 194 lb (88 kg)     Height 05/07/18 1142 6' (1.829 m)     Head Circumference --      Peak Flow --      Pain Score 05/07/18 1141 0     Pain Loc --      Pain Edu? --      Excl. in [place]? --    Constitutional: Alert and oriented. Well appearing and in no distress. Eyes: Normal exam ENT   Head: Normocephalic and atraumatic.   Mouth/Throat: Mucous membranes are moist. Cardiovascular: Normal rate, regular rhythm.  Respiratory: Normal respiratory effort without tachypnea nor retractions. Breath sounds are clear  Gastrointestinal: Soft and nontender. No distention.  Musculoskeletal: Nontender with normal range of motion in all extremities.  Neurologic:  Normal speech and language. No gross focal neurologic deficits  Skin:  Skin is warm, dry and intact.  Psychiatric: Mood and affect  are normal.  ____________________________________________    EKG  EKG reviewed and interpreted by myself shows normal sinus rhythm at 72 bpm with a narrow QRS, normal axis, normal intervals, no significant ST changes.  ____________________________________________   INITIAL IMPRESSION / ASSESSMENT AND PLAN / ED COURSE  Pertinent labs & imaging results that were available during my care of the patient were reviewed by me and considered in my medical decision making (see chart for details).  Patient presents to the emergency department for weakness earlier today.  Currently he denies any symptoms, states he feels well but decided to get checked out by recommendation of his doctor.  Denies any chest pain or trouble breathing at any point.  Largely negative review of systems.  Overall patient appears very well, normal exam.  Differential would include electrolyte or metabolic abnormality, infectious etiology, ACS, arrhythmia or palpitations.  Patient's labs are reassuring, troponin is negative.  As the patient experienced  symptoms within an hours of coming to the emergency department we will repeat a troponin to ensure that it remains negative.  Patient agreeable to this plan of care.  States he feels fine, states he is not having any symptoms at this time is asking to be discharged from the emergency department.  Does not want to wait for his second troponin, states he needs to go because the person who takes care of his wife asked to leave I discussed return precautions, patient agreeable.  ____________________________________________   FINAL CLINICAL IMPRESSION(S) / ED DIAGNOSES  Weakness    Harvest Dark, MD 05/07/18 1408

## 2018-05-07 NOTE — ED Notes (Signed)
Pt reports while at physical therapy he felt "tired and like he was coming down with something." Pt denies pain or SOB. Pt reports they checked his HR while at therapy and it "was up and down." Pt CAOx4 and denies any complaints at this time. VSS.

## 2018-05-07 NOTE — Therapy (Signed)
[place] MAIN [facility] SERVICES [address] [place], [place], [address] Phone: [phone]   Fax:  [phone]  Physical Therapy Cancellation Note (pt sent to the ED)  Patient Details  Name: Mathew Wright MRN: [id number] Date of Birth: 1935-03-08 Referring Provider: Lianne Moris   Encounter Date: 05/07/2018  PT End of Session - 05/07/18 1049    Visit Number  --    Number of Visits  --    Date for PT Re-Evaluation  --    Authorization Type  --    PT Start Time  --    Equipment Utilized During Treatment  Gait belt    Activity Tolerance  --    Behavior During Therapy  --       Past Medical History:  Diagnosis Date  . Arthritis   . Cancer ([place]) 2018   bladder  . Dehydration 08/2017   was hospitalized for this at [facility]  . Diabetes mellitus without complication ([place])   . HOH (hard of hearing)    wears hearing aids  . Hypercholesteremia   . Hypertension   . Neuromuscular disorder (HCC)    tremors...takes inderal as needed  . Pneumonia    bronchitis    Past Surgical History:  Procedure Laterality Date  . CATARACT EXTRACTION W/PHACO Right 01/01/2018   Procedure: CATARACT EXTRACTION PHACO AND INTRAOCULAR LENS PLACEMENT (IOC);  Surgeon: Eulogio Bear, MD;  Location: [facility] ORS;  Service: Ophthalmology;  Laterality: Right;  [place] 00:58.0 AP% 16.4 CDE 9.55 Fluid Pack Lot # [id number]  . COLONOSCOPY      Vitals:   05/07/18 1137  BP: 131/72  Pulse: (!) 34  SpO2: 98%    Subjective Assessment - 05/07/18 1103    Subjective  Pt reports that since he returned home from the gym today he has not been feeling well.  He says it is mostly that he is feeling tired.  He deines any chest pain, SOB, lightheadedness.  Vitals taken in sitting: BP 131/72, SpO2 98%, pulse reading 33-70.  Radial pulse palpated manually with pulse slowing and speeding up, suspect pt in a-fib.  This PT called pt's PCP (Dr. Mariel Sleet office) and spoke with RN who then  spoke with an attending who recommended the pt to go to the ED.  This PT took the pt to the ED and relayed vitals information to the receptionist.  Asked pt to call this clinic and let us know the results once he has been provided care so we can decide POC moving forward.     Pertinent History  Pt is a 83 y/o M who presents due to instability.  Pt reports BLE peripheral neuropathy up to knees Bil.  Pt says he can't walk in a straight line.  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  Has hearing aide in R ear.  Pt denies any falls in the past 6 months.  Pt goes to the gym ([employer]) 4 days/wk where he does the treadmill and then strengthening machines.  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  Pt has an aide coming in 5 days/wk who stays for 5 hours each day.  Husband assists wife with transfers and wife requires assist when ambulating.  Pt does the daily household chores.  Pt has someone clean the house every two weeks. Pt with h/o essential tremor.  Pt recently completed pelvic health physical therapy for fecal incontinence during which time he was  taught proper body mechanics for lifting when assisting his wife.    Limitations  Walking;Lifting    How long can you sit comfortably?  n/a    How long can you stand comfortably?  1 hr    How long can you walk comfortably?  0.75 mile each morning at 34mph with ease    Currently in Pain?  No/denies                               PT Education - 05/07/18 1049    Education provided  Yes    Education Details  Explained that suspect that pt may be in a-fib and needs to go to the ED    Person(s) Educated  Patient    Methods  Explanation    Comprehension  Verbalized understanding       PT Short Term Goals - 04/27/18 1627      PT SHORT TERM GOAL #1   Title  Pt will independently complete HEP at least 4 days/wk for improved carryover between sessions    Time  2    Period  Weeks    Status  New        PT Long Term Goals -  04/27/18 1627      PT LONG TERM GOAL #1   Title  Pt's Berg Balance Test score will improve to at least 48/56 to demonstrate improved balance and decreased risk of falling    Baseline  36/56    Time  6    Period  Weeks    Status  New      PT LONG TERM GOAL #2   Title  Pt's DGI score will improve to at least 21/26 to demonstrate improved balance and decreased risk of falling    Baseline  11/26    Time  6    Period  Weeks    Status  New      PT LONG TERM GOAL #3   Title  Pt's ABC score will improve to at least 80% to demonstrate perceived improvement in balance    Baseline  66.25%    Time  4    Period  Weeks    Status  New      PT LONG TERM GOAL #4   Title  Pt will deny feeling that he is not walking straight to demonstrate improved perception of balance (in the past week)    Baseline  pt feels that he is not walking straight but is veering to L and R    Time  4    Period  Weeks    Status  New              Patient will benefit from skilled therapeutic intervention in order to improve the following deficits and impairments:     Visit Diagnosis: Unsteadiness on feet  Other abnormalities of gait and mobility     Problem List Patient Active Problem List   Diagnosis Date Noted  . HLD (hyperlipidemia) 12/15/2017  . Hypogonadism male 12/15/2017  . Bacterial overgrowth syndrome 08/25/2017  . Hypotension due to hypovolemia 08/25/2017  . Abnormal Romberg test 06/09/2017  . At risk for falls 06/09/2017  . Bladder cancer ([place]) 05/06/2017  . Advanced directives, counseling/discussion 05/03/2016  . Polycythemia 04/05/2013  . BCC (basal cell carcinoma), face 03/03/2013  . History of nonmelanoma skin cancer 01/26/2013  . 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  Posterior subcapsular polar senile cataract 07/19/2011  . Vitreous degeneration 07/19/2011  . Actinic keratosis 01/24/2011  . Type II diabetes mellitus ([place]) 10/23/2010  . Fatty liver disease,  nonalcoholic 62/95/2841  . Osteopenia 02/18/2008  . Adenomatous polyp of colon 05/04/2007  . Diverticulosis of colon 04/22/2000  . Essential tremor 08/24/1996    Collie Siad PT, DPT 05/07/2018, 11:40 AM  [place] MAIN [facility] SERVICES [address] [place], [place], [address] Phone: [phone]   Fax:  [phone]  Name: Mathew Wright MRN: [id number] Date of Birth: 08/25/1935

## 2018-05-12 ENCOUNTER — Ambulatory Visit: Payer: Medicare Other | Admitting: Physical Therapy

## 2018-05-12 ENCOUNTER — Encounter: Payer: Self-pay | Admitting: Physical Therapy

## 2018-05-12 DIAGNOSIS — R2689 Other abnormalities of gait and mobility: Secondary | ICD-10-CM

## 2018-05-12 DIAGNOSIS — R2681 Unsteadiness on feet: Secondary | ICD-10-CM

## 2018-05-12 NOTE — Therapy (Signed)
[place] MAIN [facility] SERVICES [address] [place], [place], [address] Phone: [phone]   Fax:  [phone]  Physical Therapy Cancellation Note  Patient Details  Name: Mathew Wright MRN: [id number] Date of Birth: [birth year] Referring Provider: Lianne Moris   Encounter Date: 05/12/2018  PT End of Session - 05/12/18 0901    Visit Number  --    Number of Visits  --    Date for PT Re-Evaluation  --    Authorization Type  --    PT Start Time  --    Equipment Utilized During Treatment  --    Activity Tolerance  --    Behavior During Therapy  --       Past Medical History:  Diagnosis Date  . Arthritis   . Cancer ([place]) 2018   bladder  . Dehydration 08/2017   was hospitalized for this at [facility]  . Diabetes mellitus without complication ([place])   . HOH (hard of hearing)    wears hearing aids  . Hypercholesteremia   . Hypertension   . Neuromuscular disorder (HCC)    tremors...takes inderal as needed  . Pneumonia    bronchitis    Past Surgical History:  Procedure Laterality Date  . CATARACT EXTRACTION W/PHACO Right 01/01/2018   Procedure: CATARACT EXTRACTION PHACO AND INTRAOCULAR LENS PLACEMENT (IOC);  Surgeon: Eulogio Bear, MD;  Location: [facility] ORS;  Service: Ophthalmology;  Laterality: Right;  [place] 00:58.0 AP% 16.4 CDE 9.55 Fluid Pack Lot # [id number]  . COLONOSCOPY      There were no vitals filed for this visit.  Subjective Assessment - 05/12/18 0906    Subjective  Pt was seen in the ED on 6/20 where the exam was normal with labs reassuring, EKG showing normal sinus rhythm, and troponin negative and was sent home.  Pt saw his gastroenterologist to follow up about his diarrhea (following bladder cancer) yesterday who told the pt that his pulse was as low as 39 and that when he goes back to see his PCP he will need to ask for a referral to a cardiologist.  Pt reports his next appointment with his PCP is in August and his MD is out  of town and doesn't get back until July 1st.  Pt's vitals taken in sitting upon arrival BP 129/73, pulse 76, SpO2 98%. Pulse moving quickly from as low as 38 up to 78. Pt denies SOB, fatigue, chest pain and reports he feels well.   This PT called Dr. Mariel Sleet office (pt's PCP) and spoke with their patient care associate, Gilmore Laroche, who recommened that the pt come to Dr. Mariel Sleet office with an appointment at 10:30am but pt will be seen once he arrives at their clinic.  Educated pt that if he begins to feel unwell on his way to the clinic he needs to pull over and call 911.  Pt verbalized understanding.      Pertinent History  Pt is a 83 y/o M who presents due to instability.  Pt reports BLE peripheral neuropathy up to knees Bil.  Pt says he can't walk in a straight line.  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  Has hearing aide in R ear.  Pt denies any falls in the past 6 months.  Pt goes to the gym ([employer]) 4 days/wk where he does the treadmill and then strengthening machines.  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  Pt has  an aide coming in 5 days/wk who stays for 5 hours each day.  Husband assists wife with transfers and wife requires assist when ambulating.  Pt does the daily household chores.  Pt has someone clean the house every two weeks. Pt with h/o essential tremor.  Pt recently completed pelvic health physical therapy for fecal incontinence during which time he was taught proper body mechanics for lifting when assisting his wife.    Limitations  Walking;Lifting    How long can you sit comfortably?  n/a    How long can you stand comfortably?  1 hr    How long can you walk comfortably?  0.75 mile each morning at 52mph with ease    Currently in Pain?  No/denies                               PT Education - 05/12/18 0900    Education provided  Yes    Education Details  Instructed pt to report directly to PCP office regarding pulse 38-78 (as instructed by PCP  office)    Person(s) Educated  Patient    Methods  Explanation    Comprehension  Verbalized understanding       PT Short Term Goals - 04/27/18 1627      PT SHORT TERM GOAL #1   Title  Pt will independently complete HEP at least 4 days/wk for improved carryover between sessions    Time  2    Period  Weeks    Status  New        PT Long Term Goals - 04/27/18 1627      PT LONG TERM GOAL #1   Title  Pt's Berg Balance Test score will improve to at least 48/56 to demonstrate improved balance and decreased risk of falling    Baseline  36/56    Time  6    Period  Weeks    Status  New      PT LONG TERM GOAL #2   Title  Pt's DGI score will improve to at least 21/26 to demonstrate improved balance and decreased risk of falling    Baseline  11/26    Time  6    Period  Weeks    Status  New      PT LONG TERM GOAL #3   Title  Pt's ABC score will improve to at least 80% to demonstrate perceived improvement in balance    Baseline  66.25%    Time  4    Period  Weeks    Status  New      PT LONG TERM GOAL #4   Title  Pt will deny feeling that he is not walking straight to demonstrate improved perception of balance (in the past week)    Baseline  pt feels that he is not walking straight but is veering to L and R    Time  4    Period  Weeks    Status  New              Patient will benefit from skilled therapeutic intervention in order to improve the following deficits and impairments:     Visit Diagnosis: Unsteadiness on feet  Other abnormalities of gait and mobility     Problem List Patient Active Problem List   Diagnosis Date Noted  . HLD (hyperlipidemia) 12/15/2017  . Hypogonadism male 12/15/2017  . Bacterial overgrowth syndrome  08/25/2017  . Hypotension due to hypovolemia 08/25/2017  . Abnormal Romberg test 06/09/2017  . At risk for falls 06/09/2017  . Bladder cancer ([place]) 05/06/2017  . Advanced directives, counseling/discussion 05/03/2016  . Polycythemia  04/05/2013  . BCC (basal cell carcinoma), face 03/03/2013  . History of nonmelanoma skin cancer 01/26/2013  . 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 Posterior subcapsular polar senile cataract 07/19/2011  . Vitreous degeneration 07/19/2011  . Actinic keratosis 01/24/2011  . Type II diabetes mellitus ([place]) 10/23/2010  . Fatty liver disease, nonalcoholic 48/54/6270  . Osteopenia 02/18/2008  . Adenomatous polyp of colon 05/04/2007  . Diverticulosis of colon 04/22/2000  . Essential tremor 08/24/1996    Collie Siad PT, DPT 05/12/2018, 9:28 AM  [place] MAIN [facility] SERVICES [address] [place], [place], [address] Phone: [phone]   Fax:  [phone]  Name: Mathew Wright MRN: [id number] Date of Birth: 1934/12/17

## 2018-05-14 ENCOUNTER — Ambulatory Visit: Payer: Medicare Other | Admitting: Physical Therapy

## 2018-05-14 ENCOUNTER — Encounter: Payer: Self-pay | Admitting: Physical Therapy

## 2018-05-14 DIAGNOSIS — R2681 Unsteadiness on feet: Secondary | ICD-10-CM | POA: Diagnosis not present

## 2018-05-14 DIAGNOSIS — R2689 Other abnormalities of gait and mobility: Secondary | ICD-10-CM

## 2018-05-14 NOTE — Therapy (Signed)
[place] MAIN [facility] SERVICES [address] [place], [place], [address] Phone: [phone]   Fax:  [phone]  Physical Therapy Treatment  Patient Details  Name: Mathew Wright MRN: [id number] Date of Birth: Jan 04, 1935 Referring Provider: Lianne Moris   Encounter Date: 05/14/2018  PT End of Session - 05/14/18 0944    Visit Number  3    Number of Visits  13    Date for PT Re-Evaluation  06/08/18    Authorization Type  3/10 progress note    PT Start Time  0945    PT Stop Time  1029    PT Time Calculation (min)  44 min    Equipment Utilized During Treatment  Gait belt    Activity Tolerance  Patient tolerated treatment well    Behavior During Therapy  [facility] for tasks assessed/performed       Past Medical History:  Diagnosis Date  . Arthritis   . Cancer ([place]) 2018   bladder  . Dehydration 08/2017   was hospitalized for this at [facility]  . Diabetes mellitus without complication ([place])   . HOH (hard of hearing)    wears hearing aids  . Hypercholesteremia   . Hypertension   . Neuromuscular disorder (HCC)    tremors...takes inderal as needed  . Pneumonia    bronchitis    Past Surgical History:  Procedure Laterality Date  . CATARACT EXTRACTION W/PHACO Right 01/01/2018   Procedure: CATARACT EXTRACTION PHACO AND INTRAOCULAR LENS PLACEMENT (IOC);  Surgeon: Eulogio Bear, MD;  Location: [facility] ORS;  Service: Ophthalmology;  Laterality: Right;  [place] 00:58.0 AP% 16.4 CDE 9.55 Fluid Pack Lot # [id number]  . COLONOSCOPY      There were no vitals filed for this visit.  Subjective Assessment - 05/14/18 0951    Subjective  Pt reports he is doing well.  Pt went to his PCP's office on 6/25 regarding low HR with the following from char review: "recommend you hold your propranolol to rule out medications contributing to your slow heart rate.If you have tremors reoccur please inform us so we can change you other blood pressure medications as this too  can contribute to your slow heart rate. We feel comfortable with you participating in physical therapy with your slow Heart rate as long as you remain asymptomatic."     Pertinent History  Pt is a 83 y/o M who presents due to instability.  Pt reports BLE peripheral neuropathy up to knees Bil.  Pt says he can't walk in a straight line.  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  Has hearing aide in R ear.  Pt denies any falls in the past 6 months.  Pt goes to the gym ([employer]) 4 days/wk where he does the treadmill and then strengthening machines.  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  Pt has an aide coming in 5 days/wk who stays for 5 hours each day.  Husband assists wife with transfers and wife requires assist when ambulating.  Pt does the daily household chores.  Pt has someone clean the house every two weeks. Pt with h/o essential tremor.  Pt recently completed pelvic health physical therapy for fecal incontinence during which time he was taught proper body mechanics for lifting when assisting his wife.    Limitations  Walking;Lifting    How long can you sit comfortably?  n/a    How long can you stand comfortably?  1 hr    How long can you walk comfortably?  0.75 mile each morning at 13mph with ease    Currently in Pain?  No/denies         TREATMENT  Vitals taken at start of session in sitting: BP 116/86, pulse 42-84, SpO2 97%.  Marching against RTB x15 each LE without UE support  Horizontal head turns in rhomberg stance on airex pad  Vertical head turns in rhomberg stance on airex pad  Ambulating on grass outside:  Vertical and horizontal head turns  Marching while ambulating  Ambulating backward  Spontaneous direction changes forward/back/left/right  Rhomberg stance on airex and shooting baskets ~10 ft away requiring intermittent assist from // bars  Ambulating and stepping up on airex pads and over hurdles and  foam rolls                        PT  Education - 05/14/18 0944    Education provided  Yes    Education Details  Exercise technique    Person(s) Educated  Patient    Methods  Explanation;Demonstration;Verbal cues    Comprehension  Verbalized understanding;Returned demonstration;Verbal cues required;Need further instruction       PT Short Term Goals - 04/27/18 1627      PT SHORT TERM GOAL #1   Title  Pt will independently complete HEP at least 4 days/wk for improved carryover between sessions    Time  2    Period  Weeks    Status  New        PT Long Term Goals - 04/27/18 1627      PT LONG TERM GOAL #1   Title  Pt's Berg Balance Test score will improve to at least 48/56 to demonstrate improved balance and decreased risk of falling    Baseline  36/56    Time  6    Period  Weeks    Status  New      PT LONG TERM GOAL #2   Title  Pt's DGI score will improve to at least 21/26 to demonstrate improved balance and decreased risk of falling    Baseline  11/26    Time  6    Period  Weeks    Status  New      PT LONG TERM GOAL #3   Title  Pt's ABC score will improve to at least 80% to demonstrate perceived improvement in balance    Baseline  66.25%    Time  4    Period  Weeks    Status  New      PT LONG TERM GOAL #4   Title  Pt will deny feeling that he is not walking straight to demonstrate improved perception of balance (in the past week)    Baseline  pt feels that he is not walking straight but is veering to L and R    Time  4    Period  Weeks    Status  New            Plan - 05/14/18 7494    Clinical Impression Statement  Pt requires intermittent min assist for stability with horizontal and vertical head turns on uneven surface.  Pt demonstrates instability when his vesitubular system is challenged which is demonstrated when ambulating on uneven surfaces.  Educated pt on the 3 contributors to balance and the reasoning behind interventions.  Pt will continue to benefit from skilled PT interventions for  improved balance and decreased risk of falling.      Rehab Potential  Good    PT Frequency  2x / week    PT Duration  6 weeks    PT Treatment/Interventions  Therapeutic activities;Therapeutic exercise;Patient/family education;Moist Heat;Neuromuscular re-education;Manual techniques;Stair training;Gait training;Balance training;Functional mobility training;Energy conservation;Biofeedback;ADLs/Self Care Home Management;Aquatic Therapy;Cryotherapy;Electrical Stimulation;Iontophoresis 4mg /ml Dexamethasone;DME Instruction;Orthotic Fit/Training;Compression bandaging;Passive range of motion;Dry needling;Taping    PT Next Visit Plan  Progress balance program    PT Home Exercise Plan  alternating toe taps to step, tandem balance, single leg balance    Consulted and Agree with Plan of Care  Patient       Patient will benefit from skilled therapeutic intervention in order to improve the following deficits and impairments:  Postural dysfunction, Improper body mechanics, Increased fascial restricitons, Decreased strength, Decreased endurance, Decreased range of motion, Decreased coordination, Abnormal gait, Hypomobility, Decreased activity tolerance, Decreased balance, Decreased knowledge of use of DME, Decreased safety awareness, Difficulty walking, Impaired perceived functional ability, Impaired flexibility, Impaired sensation  Visit Diagnosis: Unsteadiness on feet  Other abnormalities of gait and mobility     Problem List Patient Active Problem List   Diagnosis Date Noted  . HLD (hyperlipidemia) 12/15/2017  . Hypogonadism male 12/15/2017  . Bacterial overgrowth syndrome 08/25/2017  . Hypotension due to hypovolemia 08/25/2017  . Abnormal Romberg test 06/09/2017  . At risk for falls 06/09/2017  . Bladder cancer ([place]) 05/06/2017  . Advanced directives, counseling/discussion 05/03/2016  . Polycythemia 04/05/2013  . BCC (basal cell carcinoma), face 03/03/2013  . History of nonmelanoma skin cancer  01/26/2013  . 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 Posterior subcapsular polar senile cataract 07/19/2011  . Vitreous degeneration 07/19/2011  . Actinic keratosis 01/24/2011  . Type II diabetes mellitus ([place]) 10/23/2010  . Fatty liver disease, nonalcoholic 53/66/4403  . Osteopenia 02/18/2008  . Adenomatous polyp of colon 05/04/2007  . Diverticulosis of colon 04/22/2000  . Essential tremor 08/24/1996    Collie Siad PT, DPT 05/14/2018, 10:29 AM  [place] MAIN [facility] SERVICES [address] [place], [place], [address] Phone: [phone]   Fax:  [phone]  Name: Mathew Wright MRN: [id number] Date of Birth: 08/30/1935

## 2018-05-18 ENCOUNTER — Ambulatory Visit: Payer: Medicare Other

## 2018-05-28 ENCOUNTER — Ambulatory Visit: Payer: Medicare Other | Attending: Internal Medicine | Admitting: Physical Therapy

## 2018-05-28 ENCOUNTER — Encounter: Payer: Self-pay | Admitting: Physical Therapy

## 2018-05-28 VITALS — BP 128/79 | HR 87

## 2018-05-28 DIAGNOSIS — R2689 Other abnormalities of gait and mobility: Secondary | ICD-10-CM | POA: Insufficient documentation

## 2018-05-28 DIAGNOSIS — R2681 Unsteadiness on feet: Secondary | ICD-10-CM | POA: Diagnosis not present

## 2018-05-28 NOTE — Therapy (Signed)
[place] MAIN [facility] SERVICES [address] [place], [place], [address] Phone: [phone]   Fax:  [phone]  Physical Therapy Treatment  Patient Details  Name: Mathew Wright MRN: [id number] Date of Birth: 11/19/1934 Referring Provider: Lianne Moris   Encounter Date: 05/28/2018  PT End of Session - 05/28/18 0937    Visit Number  4    Number of Visits  13    Date for PT Re-Evaluation  06/08/18    Authorization Type  4/10 progress note    PT Start Time  0939    PT Stop Time  1023    PT Time Calculation (min)  44 min    Equipment Utilized During Treatment  Gait belt    Activity Tolerance  Patient tolerated treatment well    Behavior During Therapy  [facility] for tasks assessed/performed       Past Medical History:  Diagnosis Date  . Arthritis   . Cancer ([place]) 2018   bladder  . Dehydration 08/2017   was hospitalized for this at [facility]  . Diabetes mellitus without complication ([place])   . HOH (hard of hearing)    wears hearing aids  . Hypercholesteremia   . Hypertension   . Neuromuscular disorder (HCC)    tremors...takes inderal as needed  . Pneumonia    bronchitis    Past Surgical History:  Procedure Laterality Date  . CATARACT EXTRACTION W/PHACO Right 01/01/2018   Procedure: CATARACT EXTRACTION PHACO AND INTRAOCULAR LENS PLACEMENT (IOC);  Surgeon: Eulogio Bear, MD;  Location: [facility] ORS;  Service: Ophthalmology;  Laterality: Right;  [place] 00:58.0 AP% 16.4 CDE 9.55 Fluid Pack Lot # [id number]  . COLONOSCOPY      Vitals:   05/28/18 0946  BP: 128/79  Pulse: 87  SpO2: 100%    Subjective Assessment - 05/28/18 0945    Subjective  Pt reports he has an upset stomach and that he is feeling more wobbly than usual.  Pt reports he ran into a wall while walking at home this morning.  He does say that he has not been sleeping as well since his wife came home from the hospital.  Pt felt well enough to go to the gym this morning.    Pertinent History  Pt is a 83 y/o M who presents due to instability.  Pt reports BLE peripheral neuropathy up to knees Bil.  Pt says he can't walk in a straight line.  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  Has hearing aide in R ear.  Pt denies any falls in the past 6 months.  Pt goes to the gym ([employer]) 4 days/wk where he does the treadmill and then strengthening machines.  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  Pt has an aide coming in 5 days/wk who stays for 5 hours each day.  Husband assists wife with transfers and wife requires assist when ambulating.  Pt does the daily household chores.  Pt has someone clean the house every two weeks. Pt with h/o essential tremor.  Pt recently completed pelvic health physical therapy for fecal incontinence during which time he was taught proper body mechanics for lifting when assisting his wife.    Limitations  Walking;Lifting    How long can you sit comfortably?  n/a    How long can you stand comfortably?  1 hr    How long can you walk comfortably?  0.75 mile each morning at  76mph with ease    Currently in Pain?  No/denies       TREATMENT  Pulse 85-92 at rest in sitting at start of session.  Marching against RTB x20 each LE without UE support  Horizontal head turns in romberg stance on airex pad x10 each direction  Vertical head turns in romberg stance on airex pad x10 each direction  Rhomberg stance and perturbations in all directions from this PT  Rhomberg stance on airex and shooting baskets ~10 ft away requiring intermittent assist from // bars  Obstacle course stepping up and over airex pad, stepping over hurdle, weaving in and out of cones and side stepping over cones  SLS 4x30 sec each LE with intermittent UE support  Tandem walking on firm surface x4 lengths in // bars  Ambulating in hallway with horizontal head turns reading cards on wall 4x100 ft                          PT Education - 05/28/18 0937     Education provided  Yes    Education Details  Exercise technique    Person(s) Educated  Patient    Methods  Explanation;Demonstration;Verbal cues    Comprehension  Verbalized understanding;Returned demonstration;Verbal cues required;Need further instruction       PT Short Term Goals - 04/27/18 1627      PT SHORT TERM GOAL #1   Title  Pt will independently complete HEP at least 4 days/wk for improved carryover between sessions    Time  2    Period  Weeks    Status  New        PT Long Term Goals - 04/27/18 1627      PT LONG TERM GOAL #1   Title  Pt's Berg Balance Test score will improve to at least 48/56 to demonstrate improved balance and decreased risk of falling    Baseline  36/56    Time  6    Period  Weeks    Status  New      PT LONG TERM GOAL #2   Title  Pt's DGI score will improve to at least 21/26 to demonstrate improved balance and decreased risk of falling    Baseline  11/26    Time  6    Period  Weeks    Status  New      PT LONG TERM GOAL #3   Title  Pt's ABC score will improve to at least 80% to demonstrate perceived improvement in balance    Baseline  66.25%    Time  4    Period  Weeks    Status  New      PT LONG TERM GOAL #4   Title  Pt will deny feeling that he is not walking straight to demonstrate improved perception of balance (in the past week)    Baseline  pt feels that he is not walking straight but is veering to L and R    Time  4    Period  Weeks    Status  New            Plan - 05/28/18 0946    Clinical Impression Statement  Pt reports feeling well throughout session without worsening stomach discomfort. Pt demonstrated significant difficulty with side stepping activity over cones which appears to be due to poor single leg balance.  Thus, practiced SLS on firm surface.  Encouraged pt to continue this  as part of his HEP.  Pt will benefit from continued skilled PT interventions for decreased risk of falling.      Rehab Potential  Good     PT Frequency  2x / week    PT Duration  6 weeks    PT Treatment/Interventions  Therapeutic activities;Therapeutic exercise;Patient/family education;Moist Heat;Neuromuscular re-education;Manual techniques;Stair training;Gait training;Balance training;Functional mobility training;Energy conservation;Biofeedback;ADLs/Self Care Home Management;Aquatic Therapy;Cryotherapy;Electrical Stimulation;Iontophoresis 4mg /ml Dexamethasone;DME Instruction;Orthotic Fit/Training;Compression bandaging;Passive range of motion;Dry needling;Taping    PT Next Visit Plan  Progress balance program    PT Home Exercise Plan  alternating toe taps to step, tandem balance, single leg balance    Consulted and Agree with Plan of Care  Patient       Patient will benefit from skilled therapeutic intervention in order to improve the following deficits and impairments:  Postural dysfunction, Improper body mechanics, Increased fascial restricitons, Decreased strength, Decreased endurance, Decreased range of motion, Decreased coordination, Abnormal gait, Hypomobility, Decreased activity tolerance, Decreased balance, Decreased knowledge of use of DME, Decreased safety awareness, Difficulty walking, Impaired perceived functional ability, Impaired flexibility, Impaired sensation  Visit Diagnosis: Unsteadiness on feet  Other abnormalities of gait and mobility     Problem List Patient Active Problem List   Diagnosis Date Noted  . HLD (hyperlipidemia) 12/15/2017  . Hypogonadism male 12/15/2017  . Bacterial overgrowth syndrome 08/25/2017  . Hypotension due to hypovolemia 08/25/2017  . Abnormal Romberg test 06/09/2017  . At risk for falls 06/09/2017  . Bladder cancer ([place]) 05/06/2017  . Advanced directives, counseling/discussion 05/03/2016  . Polycythemia 04/05/2013  . BCC (basal cell carcinoma), face 03/03/2013  . History of nonmelanoma skin cancer 01/26/2013  . 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  Posterior subcapsular polar senile cataract 07/19/2011  . Vitreous degeneration 07/19/2011  . Actinic keratosis 01/24/2011  . Type II diabetes mellitus ([place]) 10/23/2010  . Fatty liver disease, nonalcoholic 42/08/3127  . Osteopenia 02/18/2008  . Adenomatous polyp of colon 05/04/2007  . Diverticulosis of colon 04/22/2000  . Essential tremor 08/24/1996    Collie Siad PT, DPT 05/28/2018, 10:29 AM  [place] MAIN [facility] SERVICES [address] [place], [place], [address] Phone: [phone]   Fax:  [phone]  Name: Malacai Grantz MRN: [id number] Date of Birth: 02/04/1935

## 2018-06-02 ENCOUNTER — Ambulatory Visit: Payer: Medicare Other | Admitting: Physical Therapy

## 2018-06-04 ENCOUNTER — Ambulatory Visit: Payer: Medicare Other | Admitting: Physical Therapy

## 2018-06-04 ENCOUNTER — Encounter: Payer: Self-pay | Admitting: Physical Therapy

## 2018-06-04 DIAGNOSIS — R2681 Unsteadiness on feet: Secondary | ICD-10-CM

## 2018-06-04 DIAGNOSIS — R2689 Other abnormalities of gait and mobility: Secondary | ICD-10-CM

## 2018-06-04 NOTE — Therapy (Signed)
[place] MAIN [facility] SERVICES [address] [place], [place], [address] Phone: [phone]   Fax:  [phone]  Physical Therapy Treatment  Patient Details  Name: Mathew Wright MRN: [id number] Date of Birth: 12/03/1934 Referring Provider: Lianne Moris   Encounter Date: 06/04/2018  PT End of Session - 06/04/18 0852    Visit Number  5    Number of Visits  13    Date for PT Re-Evaluation  06/08/18    Authorization Type  5/10 progress note    PT Start Time  0853    PT Stop Time  0940    PT Time Calculation (min)  47 min    Equipment Utilized During Treatment  Gait belt    Activity Tolerance  Patient tolerated treatment well    Behavior During Therapy  [facility] for tasks assessed/performed       Past Medical History:  Diagnosis Date  . Arthritis   . Cancer ([place]) 2018   bladder  . Dehydration 08/2017   was hospitalized for this at [facility]  . Diabetes mellitus without complication ([place])   . HOH (hard of hearing)    wears hearing aids  . Hypercholesteremia   . Hypertension   . Neuromuscular disorder (HCC)    tremors...takes inderal as needed  . Pneumonia    bronchitis    Past Surgical History:  Procedure Laterality Date  . CATARACT EXTRACTION W/PHACO Right 01/01/2018   Procedure: CATARACT EXTRACTION PHACO AND INTRAOCULAR LENS PLACEMENT (IOC);  Surgeon: Eulogio Bear, MD;  Location: [facility] ORS;  Service: Ophthalmology;  Laterality: Right;  [place] 00:58.0 AP% 16.4 CDE 9.55 Fluid Pack Lot # [id number]  . COLONOSCOPY      There were no vitals filed for this visit.  Subjective Assessment - 06/04/18 0932    Subjective  Pt reports he is doing well.  He has been able to return to completing his HEP without questions or concerns.      Pertinent History  Pt is a 83 y/o M who presents due to instability.  Pt reports BLE peripheral neuropathy up to knees Bil.  Pt says he can't walk in a straight line.  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   Has hearing aide in R ear.  Pt denies any falls in the past 6 months.  Pt goes to the gym ([employer]) 4 days/wk where he does the treadmill and then strengthening machines.  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  Pt has an aide coming in 5 days/wk who stays for 5 hours each day.  Husband assists wife with transfers and wife requires assist when ambulating.  Pt does the daily household chores.  Pt has someone clean the house every two weeks. Pt with h/o essential tremor.  Pt recently completed pelvic health physical therapy for fecal incontinence during which time he was taught proper body mechanics for lifting when assisting his wife.    Limitations  Walking;Lifting    How long can you sit comfortably?  n/a    How long can you stand comfortably?  1 hr    How long can you walk comfortably?  0.75 mile each morning at 70mph with ease    Currently in Pain?  No/denies       TREATMENT   Vitals taken in sitting at start of session: Pulse 77-81, SpO2 98%, BP 129/70   Marching against RTB x20 each LE without UE support  Horizontal head turns in romberg stance on airex pad 2x10 each direction   Vertical head turns in romberg stance on airex pad 2x10 each direction   Rhomberg stance and perturbations in all directions from this PT   Ambulating in hallway while tapping balloon from L to R hand   Ambulating backward in hallway   Obstacle course stepping up and over airex pad, stepping over hurdle, turning around cones   SLS 2x30 sec each LE with intermittent UE support   Tandem walking on firm surface x6 lengths in // bars   Ambulating in hallway with horizontal head turns   Ambulating in hallway with vertical head turns   Ambulating in hallway while marching with cues to bring knees up high                          PT Education - 06/04/18 0852    Education provided  Yes    Education Details  Exercise technique    Person(s) Educated  Patient    Methods   Explanation;Demonstration;Verbal cues    Comprehension  Verbalized understanding;Returned demonstration;Verbal cues required;Need further instruction       PT Short Term Goals - 04/27/18 1627      PT SHORT TERM GOAL #1   Title  Pt will independently complete HEP at least 4 days/wk for improved carryover between sessions    Time  2    Period  Weeks    Status  New        PT Long Term Goals - 04/27/18 1627      PT LONG TERM GOAL #1   Title  Pt's Berg Balance Test score will improve to at least 48/56 to demonstrate improved balance and decreased risk of falling    Baseline  36/56    Time  6    Period  Weeks    Status  New      PT LONG TERM GOAL #2   Title  Pt's DGI score will improve to at least 21/26 to demonstrate improved balance and decreased risk of falling    Baseline  11/26    Time  6    Period  Weeks    Status  New      PT LONG TERM GOAL #3   Title  Pt's ABC score will improve to at least 80% to demonstrate perceived improvement in balance    Baseline  66.25%    Time  4    Period  Weeks    Status  New      PT LONG TERM GOAL #4   Title  Pt will deny feeling that he is not walking straight to demonstrate improved perception of balance (in the past week)    Baseline  pt feels that he is not walking straight but is veering to L and R    Time  4    Period  Weeks    Status  New            Plan - 06/04/18 9741    Clinical Impression Statement  Pt most unstable with head turns on airex pad thus more time was focused on this exercise.  Introduced ambulating and balloon tapping as pt reports instability when ambulating.  Pt continues to demonstrate significant instability with SLS, requiring intermittent UE support.  Pt has been able to pick back up doing this as part of his HEP.  Pt will benefit from continued skilled PT  interventions for improved balance and to decrease risk of falling.     Rehab Potential  Good    PT Frequency  2x / week    PT Duration  6 weeks     PT Treatment/Interventions  Therapeutic activities;Therapeutic exercise;Patient/family education;Moist Heat;Neuromuscular re-education;Manual techniques;Stair training;Gait training;Balance training;Functional mobility training;Energy conservation;Biofeedback;ADLs/Self Care Home Management;Aquatic Therapy;Cryotherapy;Electrical Stimulation;Iontophoresis 4mg /ml Dexamethasone;DME Instruction;Orthotic Fit/Training;Compression bandaging;Passive range of motion;Dry needling;Taping    PT Next Visit Plan  Progress balance program    PT Home Exercise Plan  alternating toe taps to step, tandem balance, single leg balance    Consulted and Agree with Plan of Care  Patient       Patient will benefit from skilled therapeutic intervention in order to improve the following deficits and impairments:  Postural dysfunction, Improper body mechanics, Increased fascial restricitons, Decreased strength, Decreased endurance, Decreased range of motion, Decreased coordination, Abnormal gait, Hypomobility, Decreased activity tolerance, Decreased balance, Decreased knowledge of use of DME, Decreased safety awareness, Difficulty walking, Impaired perceived functional ability, Impaired flexibility, Impaired sensation  Visit Diagnosis: Unsteadiness on feet  Other abnormalities of gait and mobility     Problem List Patient Active Problem List   Diagnosis Date Noted  . HLD (hyperlipidemia) 12/15/2017  . Hypogonadism male 12/15/2017  . Bacterial overgrowth syndrome 08/25/2017  . Hypotension due to hypovolemia 08/25/2017  . Abnormal Romberg test 06/09/2017  . At risk for falls 06/09/2017  . Bladder cancer ([place]) 05/06/2017  . Advanced directives, counseling/discussion 05/03/2016  . Polycythemia 04/05/2013  . BCC (basal cell carcinoma), face 03/03/2013  . History of nonmelanoma skin cancer 01/26/2013  . 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 Posterior subcapsular polar senile cataract 07/19/2011   . Vitreous degeneration 07/19/2011  . Actinic keratosis 01/24/2011  . Type II diabetes mellitus ([place]) 10/23/2010  . Fatty liver disease, nonalcoholic 82/50/0370  . Osteopenia 02/18/2008  . Adenomatous polyp of colon 05/04/2007  . Diverticulosis of colon 04/22/2000  . Essential tremor 08/24/1996    Collie Siad PT, DPT 06/04/2018, 9:42 AM  [place] MAIN [facility] SERVICES [address] [place], [place], [address] Phone: [phone]   Fax:  [phone]  Name: Mathew Wright MRN: [id number] Date of Birth: 1935/04/16

## 2018-06-09 ENCOUNTER — Ambulatory Visit: Payer: Medicare Other | Admitting: Physical Therapy

## 2018-06-09 ENCOUNTER — Encounter: Payer: Self-pay | Admitting: Physical Therapy

## 2018-06-09 DIAGNOSIS — R2689 Other abnormalities of gait and mobility: Secondary | ICD-10-CM

## 2018-06-09 DIAGNOSIS — R2681 Unsteadiness on feet: Secondary | ICD-10-CM

## 2018-06-09 NOTE — Therapy (Signed)
[place] MAIN [facility] SERVICES [address] [place], [place], [address] Phone: [phone]   Fax:  [phone]  Patient Details  Name: Mathew Wright MRN: [id number] Date of Birth: 02-Mar-1935 Referring Provider:  Lianne Moris, MD  Encounter Date: 06/09/2018 Patient arrived and warmed up but then had a bowel accident and was not able to continue his PT session.   [address] , [place] DPT 06/09/2018, 9:16 AM  [place] MAIN [facility] SERVICES [address] [place], [place], [address] Phone: [phone]   Fax:  [phone]

## 2018-06-11 ENCOUNTER — Ambulatory Visit: Payer: Medicare Other | Admitting: Physical Therapy

## 2018-06-15 ENCOUNTER — Ambulatory Visit (INDEPENDENT_AMBULATORY_CARE_PROVIDER_SITE_OTHER): Payer: Medicare Other | Admitting: Podiatry

## 2018-06-15 ENCOUNTER — Encounter: Payer: Self-pay | Admitting: Podiatry

## 2018-06-15 DIAGNOSIS — B351 Tinea unguium: Secondary | ICD-10-CM

## 2018-06-15 DIAGNOSIS — M79676 Pain in unspecified toe(s): Secondary | ICD-10-CM

## 2018-06-15 DIAGNOSIS — E1149 Type 2 diabetes mellitus with other diabetic neurological complication: Secondary | ICD-10-CM

## 2018-06-15 NOTE — Progress Notes (Signed)
Complaint:  Visit Type: Patient returns to my office for continued preventative foot care services. Complaint: Patient states" my nails have grown long and thick and become painful to walk and wear shoes" Patient has been diagnosed with DM with no foot complications. The patient presents for preventative foot care services. No changes to ROS.  Patient missed his last appointment due to cancer treatment.  Podiatric Exam: Vascular: dorsalis pedis and posterior tibial pulses are palpable bilateral. Capillary return is immediate. Temperature gradient is WNL. Skin turgor WNL  Sensorium: Diminished  Semmes Weinstein monofilament test. Normal tactile sensation bilaterally. Nail Exam: Pt has thick disfigured discolored nails with subungual debris noted bilateral entire nail hallux through fifth toenails Ulcer Exam: There is no evidence of ulcer or pre-ulcerative changes or infection. Orthopedic Exam: Muscle tone and strength are WNL. No limitations in general ROM. No crepitus or effusions noted. Foot type and digits show no abnormalities. Bony prominences are unremarkable. Skin: No Porokeratosis. No infection or ulcers  Diagnosis:  Onychomycosis, , Pain in right toe, pain in left toes  Treatment & Plan Procedures and Treatment: Consent by patient was obtained for treatment procedures. The patient understood the discussion of treatment and procedures well. All questions were answered thoroughly reviewed. Debridement of mycotic and hypertrophic toenails, 1 through 5 bilateral and clearing of subungual debris. No ulceration, no infection noted. ABN signed for 2019. Return Visit-Office Procedure: Patient instructed to return to the office for a follow up visit 3 months for continued evaluation and treatment.    Gardiner Barefoot DPM

## 2018-06-16 ENCOUNTER — Ambulatory Visit: Payer: Medicare Other | Admitting: Physical Therapy

## 2018-06-18 ENCOUNTER — Ambulatory Visit: Payer: Medicare Other | Admitting: Physical Therapy

## 2018-06-23 ENCOUNTER — Ambulatory Visit: Payer: Medicare Other | Admitting: Physical Therapy

## 2018-06-25 ENCOUNTER — Ambulatory Visit: Payer: Medicare Other | Admitting: Physical Therapy

## 2018-09-14 ENCOUNTER — Ambulatory Visit (INDEPENDENT_AMBULATORY_CARE_PROVIDER_SITE_OTHER): Payer: Medicare Other | Admitting: Podiatry

## 2018-09-14 ENCOUNTER — Encounter: Payer: Self-pay | Admitting: Podiatry

## 2018-09-14 DIAGNOSIS — B351 Tinea unguium: Secondary | ICD-10-CM | POA: Diagnosis not present

## 2018-09-14 DIAGNOSIS — E1149 Type 2 diabetes mellitus with other diabetic neurological complication: Secondary | ICD-10-CM

## 2018-09-14 DIAGNOSIS — M79676 Pain in unspecified toe(s): Secondary | ICD-10-CM | POA: Diagnosis not present

## 2018-09-14 NOTE — Progress Notes (Signed)
Complaint:  Visit Type: Patient returns to my office for continued preventative foot care services. Complaint: Patient states" my nails have grown long and thick and become painful to walk and wear shoes" Patient has been diagnosed with DM with no foot complications. The patient presents for preventative foot care services. No changes to ROS.  Patient missed his last appointment due to cancer treatment.  Podiatric Exam: Vascular: dorsalis pedis and posterior tibial pulses are palpable bilateral. Capillary return is immediate. Temperature gradient is WNL. Skin turgor WNL  Sensorium: Diminished  Semmes Weinstein monofilament test. Normal tactile sensation bilaterally. Nail Exam: Pt has thick disfigured discolored nails with subungual debris noted bilateral entire nail hallux through fifth toenails Ulcer Exam: There is no evidence of ulcer or pre-ulcerative changes or infection. Orthopedic Exam: Muscle tone and strength are WNL. No limitations in general ROM. No crepitus or effusions noted. Foot type and digits show no abnormalities. Bony prominences are unremarkable. Skin: No Porokeratosis. No infection or ulcers  Diagnosis:  Onychomycosis, , Pain in right toe, pain in left toes  Treatment & Plan Procedures and Treatment: Consent by patient was obtained for treatment procedures. The patient understood the discussion of treatment and procedures well. All questions were answered thoroughly reviewed. Debridement of mycotic and hypertrophic toenails, 1 through 5 bilateral and clearing of subungual debris. No ulceration, no infection noted. ABN signed for 2019. Return Visit-Office Procedure: Patient instructed to return to the office for a follow up visit 3 months for continued evaluation and treatment.    Gardiner Barefoot DPM

## 2018-12-17 ENCOUNTER — Ambulatory Visit: Payer: Medicare Other | Admitting: Podiatry

## 2018-12-21 ENCOUNTER — Encounter: Payer: Self-pay | Admitting: Podiatry

## 2018-12-21 ENCOUNTER — Ambulatory Visit (INDEPENDENT_AMBULATORY_CARE_PROVIDER_SITE_OTHER): Payer: Medicare Other | Admitting: Podiatry

## 2018-12-21 DIAGNOSIS — E1149 Type 2 diabetes mellitus with other diabetic neurological complication: Secondary | ICD-10-CM | POA: Diagnosis not present

## 2018-12-21 DIAGNOSIS — B351 Tinea unguium: Secondary | ICD-10-CM | POA: Diagnosis not present

## 2018-12-21 DIAGNOSIS — M79676 Pain in unspecified toe(s): Secondary | ICD-10-CM

## 2018-12-21 NOTE — Progress Notes (Signed)
Complaint:  Visit Type: Patient returns to my office for continued preventative foot care services. Complaint: Patient states" my nails have grown long and thick and become painful to walk and wear shoes" Patient has been diagnosed with DM with no foot complications. The patient presents for preventative foot care services. No changes to ROS.  Patient missed his last appointment due to cancer treatment.  Podiatric Exam: Vascular: dorsalis pedis and posterior tibial pulses are palpable bilateral. Capillary return is immediate. Temperature gradient is WNL. Skin turgor WNL  Sensorium: Diminished  Semmes Weinstein monofilament test. Normal tactile sensation bilaterally. Nail Exam: Pt has thick disfigured discolored nails with subungual debris noted bilateral entire nail hallux through fifth toenails Ulcer Exam: There is no evidence of ulcer or pre-ulcerative changes or infection. Orthopedic Exam: Muscle tone and strength are WNL. No limitations in general ROM. No crepitus or effusions noted. Foot type and digits show no abnormalities. Bony prominences are unremarkable. Skin: No Porokeratosis. No infection or ulcers  Diagnosis:  Onychomycosis, , Pain in right toe, pain in left toes  Treatment & Plan Procedures and Treatment: Consent by patient was obtained for treatment procedures. The patient understood the discussion of treatment and procedures well. All questions were answered thoroughly reviewed. Debridement of mycotic and hypertrophic toenails, 1 through 5 bilateral and clearing of subungual debris. No ulceration, no infection noted. Return Visit-Office Procedure: Patient instructed to return to the office for a follow up visit 3 months for continued evaluation and treatment.    Anallely Rosell DPM 

## 2019-03-11 ENCOUNTER — Other Ambulatory Visit: Payer: Self-pay

## 2019-03-11 ENCOUNTER — Encounter: Payer: Self-pay | Admitting: Podiatry

## 2019-03-11 ENCOUNTER — Ambulatory Visit (INDEPENDENT_AMBULATORY_CARE_PROVIDER_SITE_OTHER): Payer: Medicare Other | Admitting: Podiatry

## 2019-03-11 VITALS — Temp 96.7°F

## 2019-03-11 DIAGNOSIS — E1149 Type 2 diabetes mellitus with other diabetic neurological complication: Secondary | ICD-10-CM

## 2019-03-11 DIAGNOSIS — M79676 Pain in unspecified toe(s): Secondary | ICD-10-CM

## 2019-03-11 DIAGNOSIS — B351 Tinea unguium: Secondary | ICD-10-CM

## 2019-03-11 NOTE — Progress Notes (Signed)
Complaint:  Visit Type: Patient returns to my office for continued preventative foot care services. Complaint: Patient states" my nails have grown long and thick and become painful to walk and wear shoes" Patient has been diagnosed with DM with no foot complications. The patient presents for preventative foot care services. No changes to ROS.  Patient missed his last appointment due to cancer treatment.  Podiatric Exam: Vascular: dorsalis pedis and posterior tibial pulses are palpable bilateral. Capillary return is immediate. Temperature gradient is WNL. Skin turgor WNL  Sensorium: Diminished  Semmes Weinstein monofilament test. Normal tactile sensation bilaterally. Nail Exam: Pt has thick disfigured discolored nails with subungual debris noted bilateral entire nail hallux through fifth toenails Ulcer Exam: There is no evidence of ulcer or pre-ulcerative changes or infection. Orthopedic Exam: Muscle tone and strength are WNL. No limitations in general ROM. No crepitus or effusions noted. Foot type and digits show no abnormalities. Bony prominences are unremarkable. Skin: No Porokeratosis. No infection or ulcers  Diagnosis:  Onychomycosis, , Pain in right toe, pain in left toes  Treatment & Plan Procedures and Treatment: Consent by patient was obtained for treatment procedures. The patient understood the discussion of treatment and procedures well. All questions were answered thoroughly reviewed. Debridement of mycotic and hypertrophic toenails, 1 through 5 bilateral and clearing of subungual debris. No ulceration, no infection noted. Return Visit-Office Procedure: Patient instructed to return to the office for a follow up visit 3 months for continued evaluation and treatment.    Gardiner Barefoot DPM

## 2019-05-14 IMAGING — CR DG CHEST 2V
2 series · 2 of 2 positions shown · non-contrast
Comparison: December 02, 2013

CLINICAL DATA: Hot cold flashes.  Body aches.

EXAM:
CHEST  2 VIEW

[chest lat]
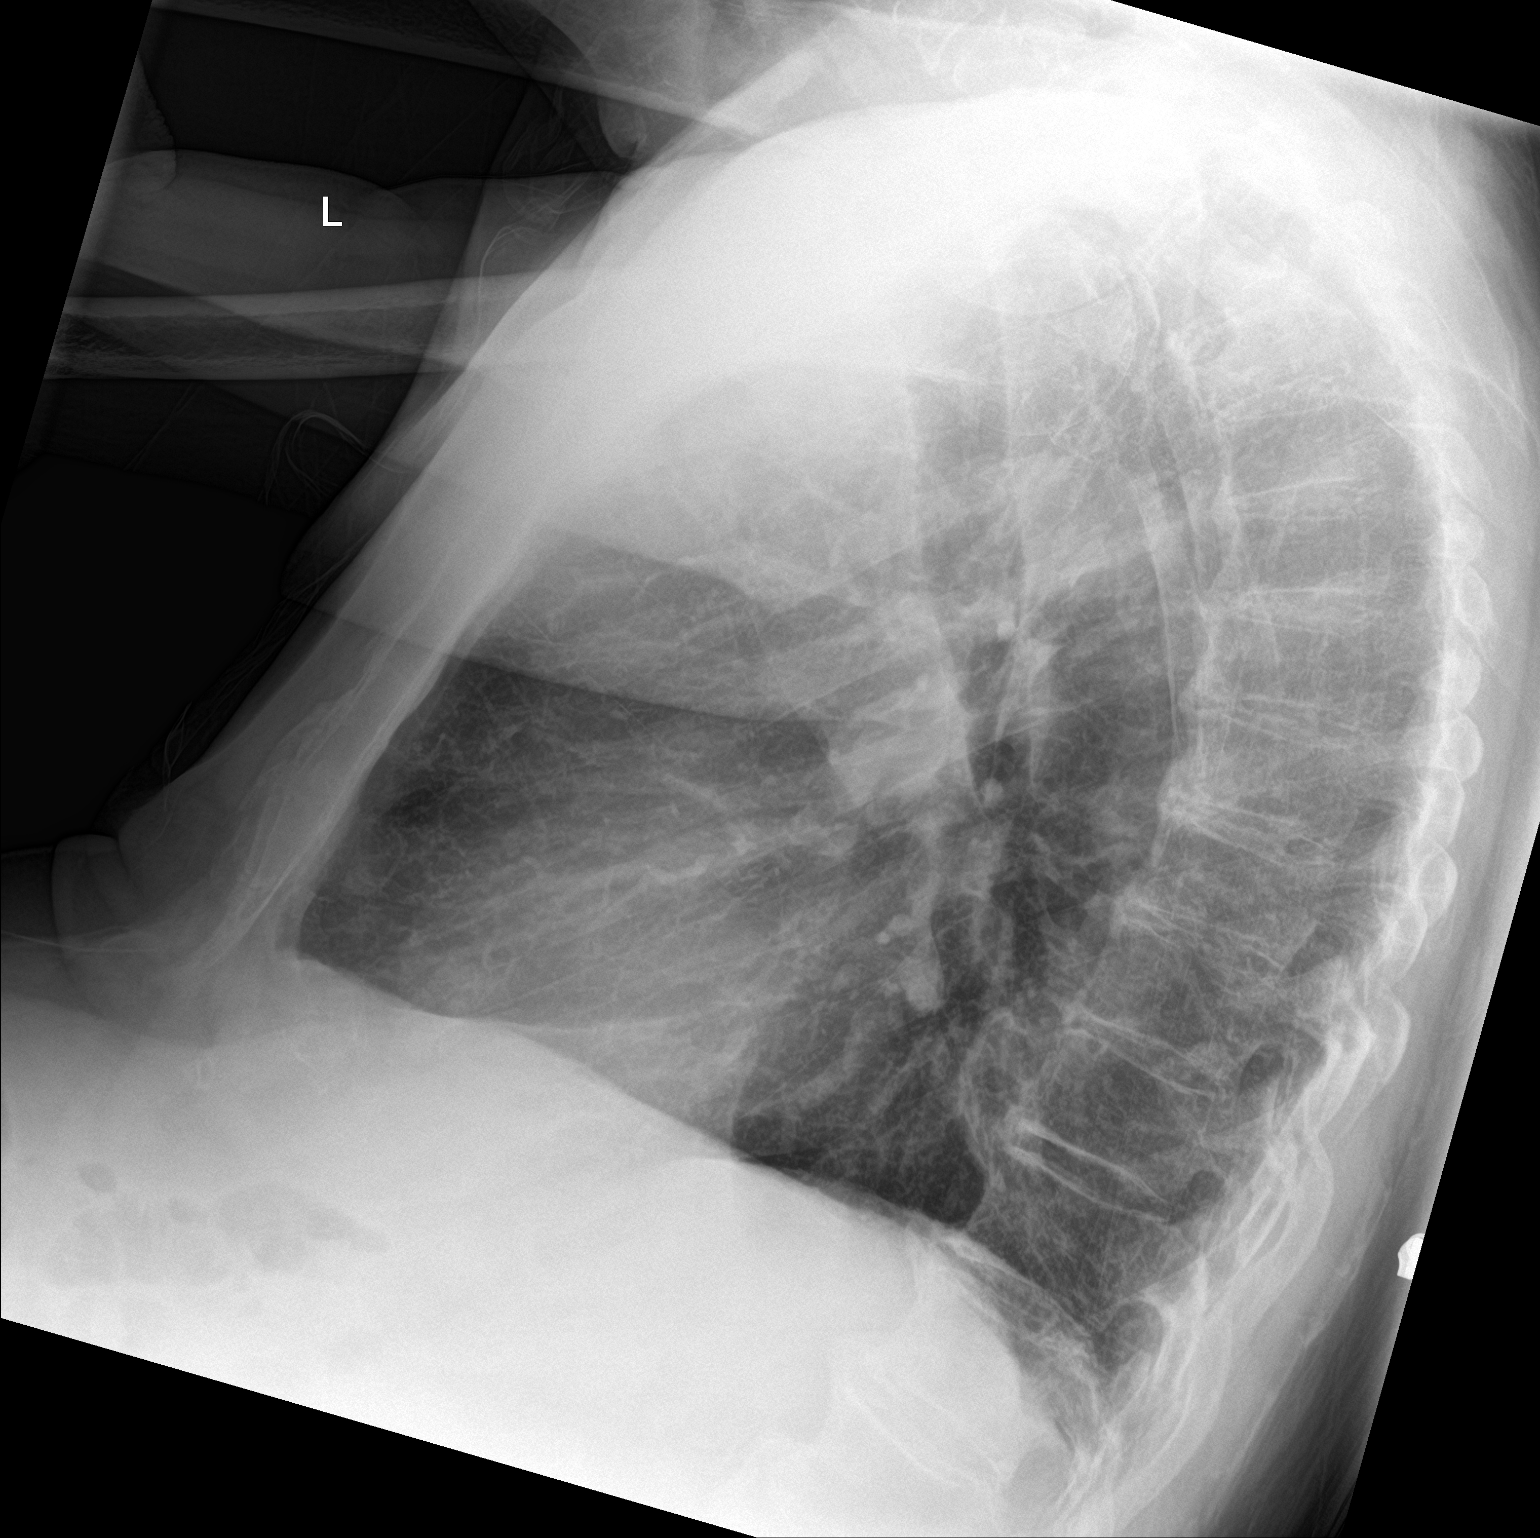

[chest ap]
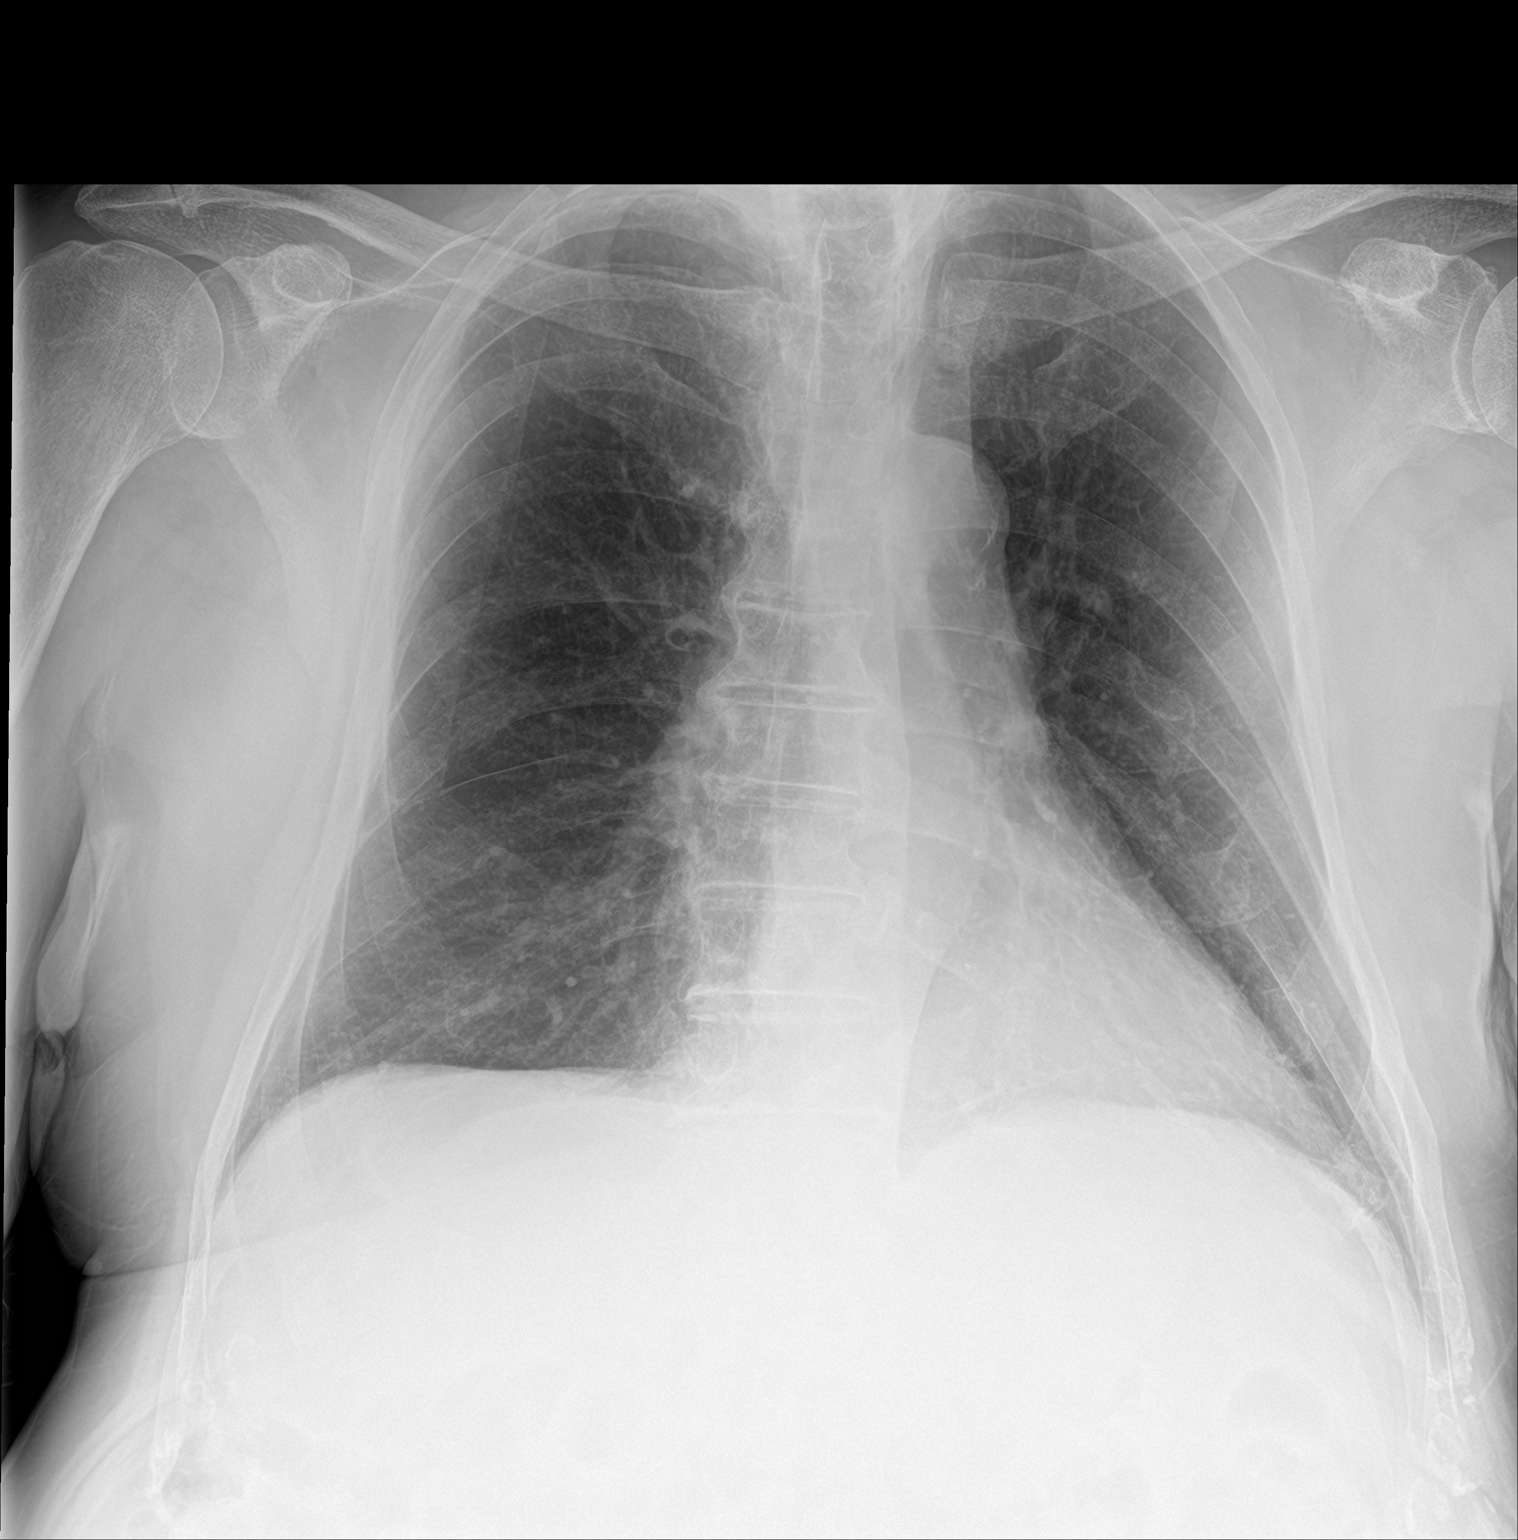

[2 of 2 positions shown; findings below may reference images not displayed]

FINDINGS: The heart size and mediastinal contours are within normal limits.
Both lungs are clear. The visualized skeletal structures are
unremarkable.
IMPRESSION: No active cardiopulmonary disease.

## 2019-06-10 ENCOUNTER — Other Ambulatory Visit: Payer: Self-pay

## 2019-06-10 ENCOUNTER — Encounter: Payer: Self-pay | Admitting: Podiatry

## 2019-06-10 ENCOUNTER — Ambulatory Visit (INDEPENDENT_AMBULATORY_CARE_PROVIDER_SITE_OTHER): Payer: Medicare Other | Admitting: Podiatry

## 2019-06-10 VITALS — Temp 97.6°F

## 2019-06-10 DIAGNOSIS — B351 Tinea unguium: Secondary | ICD-10-CM

## 2019-06-10 DIAGNOSIS — M79676 Pain in unspecified toe(s): Secondary | ICD-10-CM

## 2019-06-10 DIAGNOSIS — E1149 Type 2 diabetes mellitus with other diabetic neurological complication: Secondary | ICD-10-CM

## 2019-06-10 NOTE — Progress Notes (Signed)
Complaint:  Visit Type: Patient returns to my office for continued preventative foot care services. Complaint: Patient states" my nails have grown long and thick and become painful to walk and wear shoes" Patient has been diagnosed with DM with no foot complications. The patient presents for preventative foot care services. No changes to ROS  Podiatric Exam: Vascular: dorsalis pedis and posterior tibial pulses are palpable bilateral. Capillary return is immediate. Temperature gradient is WNL. Skin turgor WNL  Sensorium: Diminished  Semmes Weinstein monofilament test. Normal tactile sensation bilaterally. Nail Exam: Pt has thick disfigured discolored nails with subungual debris noted bilateral entire nail hallux through fifth toenails Ulcer Exam: There is no evidence of ulcer or pre-ulcerative changes or infection. Orthopedic Exam: Muscle tone and strength are WNL. No limitations in general ROM. No crepitus or effusions noted. Foot type and digits show no abnormalities. Bony prominences are unremarkable. Skin: No Porokeratosis. No infection or ulcers  Diagnosis:  Onychomycosis, , Pain in right toe, pain in left toes  Treatment & Plan Procedures and Treatment: Consent by patient was obtained for treatment procedures. The patient understood the discussion of treatment and procedures well. All questions were answered thoroughly reviewed. Debridement of mycotic and hypertrophic toenails, 1 through 5 bilateral and clearing of subungual debris. No ulceration, no infection noted.  Return Visit-Office Procedure: Patient instructed to return to the office for a follow up visit 3 months for continued evaluation and treatment.    Margaree Sandhu DPM 

## 2019-09-09 ENCOUNTER — Ambulatory Visit (INDEPENDENT_AMBULATORY_CARE_PROVIDER_SITE_OTHER): Payer: Medicare Other | Admitting: Podiatry

## 2019-09-09 ENCOUNTER — Other Ambulatory Visit: Payer: Self-pay

## 2019-09-09 ENCOUNTER — Encounter: Payer: Self-pay | Admitting: Podiatry

## 2019-09-09 DIAGNOSIS — M79676 Pain in unspecified toe(s): Secondary | ICD-10-CM

## 2019-09-09 DIAGNOSIS — B351 Tinea unguium: Secondary | ICD-10-CM | POA: Diagnosis not present

## 2019-09-09 DIAGNOSIS — E1149 Type 2 diabetes mellitus with other diabetic neurological complication: Secondary | ICD-10-CM

## 2019-09-09 NOTE — Progress Notes (Signed)
Complaint:  Visit Type: Patient returns to my office for continued preventative foot care services. Complaint: Patient states" my nails have grown long and thick and become painful to walk and wear shoes" Patient has been diagnosed with DM with no foot complications. The patient presents for preventative foot care services. No changes to ROS  Podiatric Exam: Vascular: dorsalis pedis and posterior tibial pulses are palpable bilateral. Capillary return is immediate. Temperature gradient is WNL. Skin turgor WNL  Sensorium: Diminished  Semmes Weinstein monofilament test. Normal tactile sensation bilaterally. Nail Exam: Pt has thick disfigured discolored nails with subungual debris noted bilateral entire nail hallux through fifth toenails Ulcer Exam: There is no evidence of ulcer or pre-ulcerative changes or infection. Orthopedic Exam: Muscle tone and strength are WNL. No limitations in general ROM. No crepitus or effusions noted. Foot type and digits show no abnormalities. Bony prominences are unremarkable. Skin: No Porokeratosis. No infection or ulcers  Diagnosis:  Onychomycosis, , Pain in right toe, pain in left toes  Treatment & Plan Procedures and Treatment: Consent by patient was obtained for treatment procedures. The patient understood the discussion of treatment and procedures well. All questions were answered thoroughly reviewed. Debridement of mycotic and hypertrophic toenails, 1 through 5 bilateral and clearing of subungual debris. No ulceration, no infection noted.  Return Visit-Office Procedure: Patient instructed to return to the office for a follow up visit 3 months for continued evaluation and treatment.    Nimesh Riolo DPM 

## 2019-12-09 ENCOUNTER — Other Ambulatory Visit: Payer: Self-pay

## 2019-12-09 ENCOUNTER — Ambulatory Visit (INDEPENDENT_AMBULATORY_CARE_PROVIDER_SITE_OTHER): Payer: Medicare Other | Admitting: Podiatry

## 2019-12-09 ENCOUNTER — Encounter: Payer: Self-pay | Admitting: Podiatry

## 2019-12-09 DIAGNOSIS — M79676 Pain in unspecified toe(s): Secondary | ICD-10-CM | POA: Diagnosis not present

## 2019-12-09 DIAGNOSIS — E1149 Type 2 diabetes mellitus with other diabetic neurological complication: Secondary | ICD-10-CM | POA: Diagnosis not present

## 2019-12-09 DIAGNOSIS — B351 Tinea unguium: Secondary | ICD-10-CM

## 2019-12-09 NOTE — Progress Notes (Signed)
Complaint:  Visit Type: Patient returns to my office for continued preventative foot care services. Complaint: Patient states" my nails have grown long and thick and become painful to walk and wear shoes" Patient has been diagnosed with DM with no foot complications. The patient presents for preventative foot care services. No changes to ROS  Podiatric Exam: Vascular: dorsalis pedis and posterior tibial pulses are palpable bilateral. Capillary return is immediate. Temperature gradient is WNL. Skin turgor WNL  Sensorium: Diminished  Semmes Weinstein monofilament test. Normal tactile sensation bilaterally. Nail Exam: Pt has thick disfigured discolored nails with subungual debris noted bilateral entire nail hallux through fifth toenails Ulcer Exam: There is no evidence of ulcer or pre-ulcerative changes or infection. Orthopedic Exam: Muscle tone and strength are WNL. No limitations in general ROM. No crepitus or effusions noted. Foot type and digits show no abnormalities. Bony prominences are unremarkable. Skin: No Porokeratosis. No infection or ulcers  Diagnosis:  Onychomycosis, , Pain in right toe, pain in left toes  Treatment & Plan Procedures and Treatment: Consent by patient was obtained for treatment procedures. The patient understood the discussion of treatment and procedures well. All questions were answered thoroughly reviewed. Debridement of mycotic and hypertrophic toenails, 1 through 5 bilateral and clearing of subungual debris. No ulceration, no infection noted.  Return Visit-Office Procedure: Patient instructed to return to the office for a follow up visit 3 months for continued evaluation and treatment.    Mabell Esguerra DPM 

## 2020-03-09 ENCOUNTER — Other Ambulatory Visit: Payer: Self-pay

## 2020-03-09 ENCOUNTER — Ambulatory Visit (INDEPENDENT_AMBULATORY_CARE_PROVIDER_SITE_OTHER): Payer: Medicare Other | Admitting: Podiatry

## 2020-03-09 ENCOUNTER — Encounter: Payer: Self-pay | Admitting: Podiatry

## 2020-03-09 DIAGNOSIS — B351 Tinea unguium: Secondary | ICD-10-CM

## 2020-03-09 DIAGNOSIS — M79676 Pain in unspecified toe(s): Secondary | ICD-10-CM | POA: Diagnosis not present

## 2020-03-09 DIAGNOSIS — E1149 Type 2 diabetes mellitus with other diabetic neurological complication: Secondary | ICD-10-CM | POA: Diagnosis not present

## 2020-03-09 NOTE — Progress Notes (Signed)
This patient returns to my office for at risk foot care.  This patient requires this care by a professional since this patient will be at risk due to having diabetes type 2.  This patient is unable to cut nails himself since the patient cannot reach his nails.These nails are painful walking and wearing shoes.  This patient presents for at risk foot care today.  General Appearance  Alert, conversant and in no acute stress.  Vascular  Dorsalis pedis and posterior tibial  pulses are palpable  bilaterally.  Capillary return is within normal limits  bilaterally. Temperature is within normal limits  bilaterally.  Neurologic  Senn-Weinstein monofilament wire test diminished  bilaterally. Muscle power within normal limits bilaterally.  Nails Thick disfigured discolored nails with subungual debris  from hallux to fifth toes bilaterally. No evidence of bacterial infection or drainage bilaterally.  Orthopedic  No limitations of motion  feet .  No crepitus or effusions noted.  No bony pathology or digital deformities noted.  Skin  normotropic skin with no porokeratosis noted bilaterally.  No signs of infections or ulcers noted.     Onychomycosis  Pain in right toes  Pain in left toes  Consent was obtained for treatment procedures.   Mechanical debridement of nails 1-5  bilaterally performed with a nail nipper.  Filed with dremel without incident.    Return office visit    3 months                  Told patient to return for periodic foot care and evaluation due to potential at risk complications.   Laiken Nohr DPM  

## 2020-06-12 ENCOUNTER — Encounter: Payer: Self-pay | Admitting: Podiatry

## 2020-06-12 ENCOUNTER — Other Ambulatory Visit: Payer: Self-pay

## 2020-06-12 ENCOUNTER — Ambulatory Visit (INDEPENDENT_AMBULATORY_CARE_PROVIDER_SITE_OTHER): Payer: Medicare Other | Admitting: Podiatry

## 2020-06-12 DIAGNOSIS — B351 Tinea unguium: Secondary | ICD-10-CM

## 2020-06-12 DIAGNOSIS — E1149 Type 2 diabetes mellitus with other diabetic neurological complication: Secondary | ICD-10-CM | POA: Diagnosis not present

## 2020-06-12 DIAGNOSIS — M79676 Pain in unspecified toe(s): Secondary | ICD-10-CM | POA: Diagnosis not present

## 2020-06-12 NOTE — Progress Notes (Signed)
This patient returns to my office for at risk foot care.  This patient requires this care by a professional since this patient will be at risk due to having diabetes type 2.  This patient is unable to cut nails himself since the patient cannot reach his nails.These nails are painful walking and wearing shoes.  This patient presents for at risk foot care today.  General Appearance  Alert, conversant and in no acute stress.  Vascular  Dorsalis pedis and posterior tibial  pulses are palpable  bilaterally.  Capillary return is within normal limits  bilaterally. Temperature is within normal limits  bilaterally.  Neurologic  Senn-Weinstein monofilament wire test diminished  bilaterally. Muscle power within normal limits bilaterally.  Nails Thick disfigured discolored nails with subungual debris  from hallux to fifth toes bilaterally. No evidence of bacterial infection or drainage bilaterally.  Orthopedic  No limitations of motion  feet .  No crepitus or effusions noted.  No bony pathology or digital deformities noted.  Skin  normotropic skin with no porokeratosis noted bilaterally.  No signs of infections or ulcers noted.     Onychomycosis  Pain in right toes  Pain in left toes  Consent was obtained for treatment procedures.   Mechanical debridement of nails 1-5  bilaterally performed with a nail nipper.  Filed with dremel without incident.    Return office visit    3 months                  Told patient to return for periodic foot care and evaluation due to potential at risk complications.   Mathew Wright DPM  

## 2020-09-14 ENCOUNTER — Encounter: Payer: Self-pay | Admitting: Podiatry

## 2020-09-14 ENCOUNTER — Ambulatory Visit (INDEPENDENT_AMBULATORY_CARE_PROVIDER_SITE_OTHER): Payer: Medicare Other | Admitting: Podiatry

## 2020-09-14 ENCOUNTER — Other Ambulatory Visit: Payer: Self-pay

## 2020-09-14 DIAGNOSIS — M79676 Pain in unspecified toe(s): Secondary | ICD-10-CM

## 2020-09-14 DIAGNOSIS — B351 Tinea unguium: Secondary | ICD-10-CM | POA: Diagnosis not present

## 2020-09-14 DIAGNOSIS — E1149 Type 2 diabetes mellitus with other diabetic neurological complication: Secondary | ICD-10-CM | POA: Diagnosis not present

## 2020-09-14 NOTE — Progress Notes (Signed)
This patient returns to my office for at risk foot care.  This patient requires this care by a professional since this patient will be at risk due to having diabetes type 2.  This patient is unable to cut nails himself since the patient cannot reach his nails.These nails are painful walking and wearing shoes.  This patient presents for at risk foot care today.  General Appearance  Alert, conversant and in no acute stress.  Vascular  Dorsalis pedis and posterior tibial  pulses are palpable  bilaterally.  Capillary return is within normal limits  bilaterally. Temperature is within normal limits  bilaterally.  Neurologic  Senn-Weinstein monofilament wire test diminished  bilaterally. Muscle power within normal limits bilaterally.  Nails Thick disfigured discolored nails with subungual debris  from hallux to fifth toes bilaterally. No evidence of bacterial infection or drainage bilaterally.  Orthopedic  No limitations of motion  feet .  No crepitus or effusions noted.  No bony pathology or digital deformities noted.  Skin  normotropic skin with no porokeratosis noted bilaterally.  No signs of infections or ulcers noted.     Onychomycosis  Pain in right toes  Pain in left toes  Consent was obtained for treatment procedures.   Mechanical debridement of nails 1-5  bilaterally performed with a nail nipper.  Filed with dremel without incident.    Return office visit    3 months                  Told patient to return for periodic foot care and evaluation due to potential at risk complications.   Vear Staton DPM  

## 2020-12-18 ENCOUNTER — Ambulatory Visit (INDEPENDENT_AMBULATORY_CARE_PROVIDER_SITE_OTHER): Payer: Medicare Other | Admitting: Podiatry

## 2020-12-18 ENCOUNTER — Other Ambulatory Visit: Payer: Self-pay

## 2020-12-18 ENCOUNTER — Encounter: Payer: Self-pay | Admitting: Podiatry

## 2020-12-18 DIAGNOSIS — M79676 Pain in unspecified toe(s): Secondary | ICD-10-CM | POA: Diagnosis not present

## 2020-12-18 DIAGNOSIS — E1149 Type 2 diabetes mellitus with other diabetic neurological complication: Secondary | ICD-10-CM | POA: Diagnosis not present

## 2020-12-18 DIAGNOSIS — B351 Tinea unguium: Secondary | ICD-10-CM

## 2020-12-18 NOTE — Progress Notes (Signed)
This patient returns to my office for at risk foot care.  This patient requires this care by a professional since this patient will be at risk due to having diabetes type 2.  This patient is unable to cut nails himself since the patient cannot reach his nails.These nails are painful walking and wearing shoes.  This patient presents for at risk foot care today.  General Appearance  Alert, conversant and in no acute stress.  Vascular  Dorsalis pedis and posterior tibial  pulses are palpable  bilaterally.  Capillary return is within normal limits  bilaterally. Temperature is within normal limits  bilaterally.  Neurologic  Senn-Weinstein monofilament wire test diminished  bilaterally. Muscle power within normal limits bilaterally.  Nails Thick disfigured discolored nails with subungual debris  from hallux to fifth toes bilaterally. No evidence of bacterial infection or drainage bilaterally.  Orthopedic  No limitations of motion  feet .  No crepitus or effusions noted.  No bony pathology or digital deformities noted.  Skin  normotropic skin with no porokeratosis noted bilaterally.  No signs of infections or ulcers noted.     Onychomycosis  Pain in right toes  Pain in left toes  Consent was obtained for treatment procedures.   Mechanical debridement of nails 1-5  bilaterally performed with a nail nipper.  Filed with dremel without incident.    Return office visit    3 months                  Told patient to return for periodic foot care and evaluation due to potential at risk complications.   Kiasia Chou DPM  

## 2021-03-19 ENCOUNTER — Ambulatory Visit: Payer: Medicare Other | Admitting: Podiatry

## 2021-03-22 ENCOUNTER — Ambulatory Visit (INDEPENDENT_AMBULATORY_CARE_PROVIDER_SITE_OTHER): Payer: Medicare Other | Admitting: Podiatry

## 2021-03-22 ENCOUNTER — Encounter: Payer: Self-pay | Admitting: Podiatry

## 2021-03-22 ENCOUNTER — Other Ambulatory Visit: Payer: Self-pay

## 2021-03-22 DIAGNOSIS — M79676 Pain in unspecified toe(s): Secondary | ICD-10-CM | POA: Diagnosis not present

## 2021-03-22 DIAGNOSIS — B351 Tinea unguium: Secondary | ICD-10-CM

## 2021-03-22 DIAGNOSIS — E1149 Type 2 diabetes mellitus with other diabetic neurological complication: Secondary | ICD-10-CM | POA: Diagnosis not present

## 2021-03-22 NOTE — Progress Notes (Signed)
This patient returns to my office for at risk foot care.  This patient requires this care by a professional since this patient will be at risk due to having diabetes type 2.  This patient is unable to cut nails himself since the patient cannot reach his nails.These nails are painful walking and wearing shoes.  This patient presents for at risk foot care today.  General Appearance  Alert, conversant and in no acute stress.  Vascular  Dorsalis pedis and posterior tibial  pulses are palpable  bilaterally.  Capillary return is within normal limits  bilaterally. Temperature is within normal limits  bilaterally.  Neurologic  Senn-Weinstein monofilament wire test diminished  bilaterally. Muscle power within normal limits bilaterally.  Nails Thick disfigured discolored nails with subungual debris  from hallux to fifth toes bilaterally. No evidence of bacterial infection or drainage bilaterally.  Orthopedic  No limitations of motion  feet .  No crepitus or effusions noted.  No bony pathology or digital deformities noted.  Skin  normotropic skin with no porokeratosis noted bilaterally.  No signs of infections or ulcers noted.     Onychomycosis  Pain in right toes  Pain in left toes  Consent was obtained for treatment procedures.   Mechanical debridement of nails 1-5  bilaterally performed with a nail nipper.  Filed with dremel without incident.    Return office visit    3 months                  Told patient to return for periodic foot care and evaluation due to potential at risk complications.   Evadene Wardrip DPM  

## 2021-06-28 ENCOUNTER — Ambulatory Visit: Payer: Medicare Other | Admitting: Podiatry

## 2021-07-26 ENCOUNTER — Ambulatory Visit (INDEPENDENT_AMBULATORY_CARE_PROVIDER_SITE_OTHER): Payer: Medicare Other | Admitting: Podiatry

## 2021-07-26 ENCOUNTER — Encounter: Payer: Self-pay | Admitting: Podiatry

## 2021-07-26 ENCOUNTER — Other Ambulatory Visit: Payer: Self-pay

## 2021-07-26 DIAGNOSIS — E1149 Type 2 diabetes mellitus with other diabetic neurological complication: Secondary | ICD-10-CM | POA: Diagnosis not present

## 2021-07-26 DIAGNOSIS — M79676 Pain in unspecified toe(s): Secondary | ICD-10-CM

## 2021-07-26 DIAGNOSIS — B351 Tinea unguium: Secondary | ICD-10-CM | POA: Diagnosis not present

## 2021-07-26 NOTE — Progress Notes (Signed)
This patient returns to my office for at risk foot care.  This patient requires this care by a professional since this patient will be at risk due to having diabetes type 2.  This patient is unable to cut nails himself since the patient cannot reach his nails.These nails are painful walking and wearing shoes.  This patient presents for at risk foot care today.  General Appearance  Alert, conversant and in no acute stress.  Vascular  Dorsalis pedis and posterior tibial  pulses are palpable  bilaterally.  Capillary return is within normal limits  bilaterally. Temperature is within normal limits  bilaterally.  Neurologic  Senn-Weinstein monofilament wire test diminished  bilaterally. Muscle power within normal limits bilaterally.  Nails Thick disfigured discolored nails with subungual debris  from hallux to fifth toes bilaterally. No evidence of bacterial infection or drainage bilaterally.  Orthopedic  No limitations of motion  feet .  No crepitus or effusions noted.  No bony pathology or digital deformities noted.  Skin  normotropic skin with no porokeratosis noted bilaterally.  No signs of infections or ulcers noted.     Onychomycosis  Pain in right toes  Pain in left toes  Consent was obtained for treatment procedures.   Mechanical debridement of nails 1-5  bilaterally performed with a nail nipper.  Filed with dremel without incident.    Return office visit    3 months                  Told patient to return for periodic foot care and evaluation due to potential at risk complications.   Sohana Austell DPM  

## 2021-10-25 ENCOUNTER — Other Ambulatory Visit: Payer: Self-pay

## 2021-10-25 ENCOUNTER — Encounter: Payer: Self-pay | Admitting: Podiatry

## 2021-10-25 ENCOUNTER — Ambulatory Visit (INDEPENDENT_AMBULATORY_CARE_PROVIDER_SITE_OTHER): Payer: Medicare Other | Admitting: Podiatry

## 2021-10-25 DIAGNOSIS — E1149 Type 2 diabetes mellitus with other diabetic neurological complication: Secondary | ICD-10-CM | POA: Diagnosis not present

## 2021-10-25 DIAGNOSIS — B351 Tinea unguium: Secondary | ICD-10-CM

## 2021-10-25 DIAGNOSIS — M79676 Pain in unspecified toe(s): Secondary | ICD-10-CM

## 2021-10-25 NOTE — Progress Notes (Signed)
This patient returns to my office for at risk foot care.  This patient requires this care by a professional since this patient will be at risk due to having diabetes type 2.  This patient is unable to cut nails himself since the patient cannot reach his nails.These nails are painful walking and wearing shoes.  This patient presents for at risk foot care today.  General Appearance  Alert, conversant and in no acute stress.  Vascular  Dorsalis pedis and posterior tibial  pulses are palpable  bilaterally.  Capillary return is within normal limits  bilaterally. Temperature is within normal limits  bilaterally.  Neurologic  Senn-Weinstein monofilament wire test diminished  bilaterally. Muscle power within normal limits bilaterally.  Nails Thick disfigured discolored nails with subungual debris  from hallux to fifth toes bilaterally. No evidence of bacterial infection or drainage bilaterally.  Orthopedic  No limitations of motion  feet .  No crepitus or effusions noted.  No bony pathology or digital deformities noted.  Skin  normotropic skin with no porokeratosis noted bilaterally.  No signs of infections or ulcers noted.     Onychomycosis  Pain in right toes  Pain in left toes  Consent was obtained for treatment procedures.   Mechanical debridement of nails 1-5  bilaterally performed with a nail nipper.  Filed with dremel without incident.    Return office visit    3 months                  Told patient to return for periodic foot care and evaluation due to potential at risk complications.   Stevin Bielinski DPM  

## 2022-01-24 ENCOUNTER — Encounter: Payer: Self-pay | Admitting: Podiatry

## 2022-01-24 ENCOUNTER — Ambulatory Visit (INDEPENDENT_AMBULATORY_CARE_PROVIDER_SITE_OTHER): Payer: Medicare Other | Admitting: Podiatry

## 2022-01-24 ENCOUNTER — Other Ambulatory Visit: Payer: Self-pay

## 2022-01-24 DIAGNOSIS — E1149 Type 2 diabetes mellitus with other diabetic neurological complication: Secondary | ICD-10-CM | POA: Diagnosis not present

## 2022-01-24 DIAGNOSIS — B351 Tinea unguium: Secondary | ICD-10-CM

## 2022-01-24 DIAGNOSIS — M79676 Pain in unspecified toe(s): Secondary | ICD-10-CM

## 2022-01-24 NOTE — Progress Notes (Signed)
This patient returns to my office for at risk foot care.  This patient requires this care by a professional since this patient will be at risk due to having diabetes type 2.  This patient is unable to cut nails himself since the patient cannot reach his nails.These nails are painful walking and wearing shoes.  This patient presents for at risk foot care today.  General Appearance  Alert, conversant and in no acute stress.  Vascular  Dorsalis pedis and posterior tibial  pulses are palpable  bilaterally.  Capillary return is within normal limits  bilaterally. Temperature is within normal limits  bilaterally.  Neurologic  Senn-Weinstein monofilament wire test diminished  bilaterally. Muscle power within normal limits bilaterally.  Nails Thick disfigured discolored nails with subungual debris  from hallux to fifth toes bilaterally. No evidence of bacterial infection or drainage bilaterally.  Orthopedic  No limitations of motion  feet .  No crepitus or effusions noted.  No bony pathology or digital deformities noted.  Skin  normotropic skin with no porokeratosis noted bilaterally.  No signs of infections or ulcers noted.     Onychomycosis  Pain in right toes  Pain in left toes  Consent was obtained for treatment procedures.   Mechanical debridement of nails 1-5  bilaterally performed with a nail nipper.  Filed with dremel without incident.    Return office visit    3 months                  Told patient to return for periodic foot care and evaluation due to potential at risk complications.   Christipher Rieger DPM  

## 2022-04-25 ENCOUNTER — Ambulatory Visit (INDEPENDENT_AMBULATORY_CARE_PROVIDER_SITE_OTHER): Payer: Medicare Other | Admitting: Podiatry

## 2022-04-25 ENCOUNTER — Encounter: Payer: Self-pay | Admitting: Podiatry

## 2022-04-25 DIAGNOSIS — E1149 Type 2 diabetes mellitus with other diabetic neurological complication: Secondary | ICD-10-CM | POA: Diagnosis not present

## 2022-04-25 DIAGNOSIS — M79676 Pain in unspecified toe(s): Secondary | ICD-10-CM

## 2022-04-25 DIAGNOSIS — B351 Tinea unguium: Secondary | ICD-10-CM

## 2022-04-25 NOTE — Progress Notes (Signed)
This patient returns to my office for at risk foot care.  This patient requires this care by a professional since this patient will be at risk due to having diabetes type 2.  This patient is unable to cut nails himself since the patient cannot reach his nails.These nails are painful walking and wearing shoes.  This patient presents for at risk foot care today.  General Appearance  Alert, conversant and in no acute stress.  Vascular  Dorsalis pedis and posterior tibial  pulses are palpable  bilaterally.  Capillary return is within normal limits  bilaterally. Temperature is within normal limits  bilaterally.  Neurologic  Senn-Weinstein monofilament wire test diminished  bilaterally. Muscle power within normal limits bilaterally.  Nails Thick disfigured discolored nails with subungual debris  from hallux to fifth toes bilaterally. No evidence of bacterial infection or drainage bilaterally.  Orthopedic  No limitations of motion  feet .  No crepitus or effusions noted.  No bony pathology or digital deformities noted.  Skin  normotropic skin with no porokeratosis noted bilaterally.  No signs of infections or ulcers noted.     Onychomycosis  Pain in right toes  Pain in left toes  Consent was obtained for treatment procedures.   Mechanical debridement of nails 1-5  bilaterally performed with a nail nipper.  Filed with dremel without incident.    Return office visit    3 months                  Told patient to return for periodic foot care and evaluation due to potential at risk complications.   Madyx Delfin DPM  

## 2022-08-08 ENCOUNTER — Ambulatory Visit (INDEPENDENT_AMBULATORY_CARE_PROVIDER_SITE_OTHER): Payer: Medicare Other | Admitting: Podiatry

## 2022-08-08 ENCOUNTER — Encounter: Payer: Self-pay | Admitting: Podiatry

## 2022-08-08 DIAGNOSIS — B351 Tinea unguium: Secondary | ICD-10-CM | POA: Diagnosis not present

## 2022-08-08 DIAGNOSIS — E1149 Type 2 diabetes mellitus with other diabetic neurological complication: Secondary | ICD-10-CM | POA: Diagnosis not present

## 2022-08-08 DIAGNOSIS — M79676 Pain in unspecified toe(s): Secondary | ICD-10-CM

## 2022-08-08 NOTE — Progress Notes (Signed)
This patient returns to my office for at risk foot care.  This patient requires this care by a professional since this patient will be at risk due to having diabetes type 2.  This patient is unable to cut nails himself since the patient cannot reach his nails.These nails are painful walking and wearing shoes.  This patient presents for at risk foot care today.  General Appearance  Alert, conversant and in no acute stress.  Vascular  Dorsalis pedis and posterior tibial  pulses are palpable  bilaterally.  Capillary return is within normal limits  bilaterally. Temperature is within normal limits  bilaterally.  Neurologic  Senn-Weinstein monofilament wire test diminished  bilaterally. Muscle power within normal limits bilaterally.  Nails Thick disfigured discolored nails with subungual debris  from hallux to fifth toes bilaterally. No evidence of bacterial infection or drainage bilaterally.  Orthopedic  No limitations of motion  feet .  No crepitus or effusions noted.  No bony pathology or digital deformities noted.  Skin  normotropic skin with no porokeratosis noted bilaterally.  No signs of infections or ulcers noted.     Onychomycosis  Pain in right toes  Pain in left toes  Consent was obtained for treatment procedures.   Mechanical debridement of nails 1-5  bilaterally performed with a nail nipper.  Filed with dremel without incident.    Return office visit    3 months                  Told patient to return for periodic foot care and evaluation due to potential at risk complications.   Kendelle Schweers DPM  

## 2022-10-11 ENCOUNTER — Emergency Department: Payer: Medicare Other

## 2022-10-11 ENCOUNTER — Other Ambulatory Visit: Payer: Self-pay

## 2022-10-11 ENCOUNTER — Emergency Department
Admission: EM | Admit: 2022-10-11 | Discharge: 2022-10-11 | Disposition: A | Discharge status: Home / Self Care | Payer: Medicare Other | Attending: Emergency Medicine | Admitting: Emergency Medicine

## 2022-10-11 DIAGNOSIS — Z8551 Personal history of malignant neoplasm of bladder: Secondary | ICD-10-CM | POA: Diagnosis not present

## 2022-10-11 DIAGNOSIS — R2681 Unsteadiness on feet: Secondary | ICD-10-CM | POA: Insufficient documentation

## 2022-10-11 DIAGNOSIS — R531 Weakness: Secondary | ICD-10-CM | POA: Diagnosis present

## 2022-10-11 DIAGNOSIS — I10 Essential (primary) hypertension: Secondary | ICD-10-CM | POA: Insufficient documentation

## 2022-10-11 DIAGNOSIS — R2689 Other abnormalities of gait and mobility: Secondary | ICD-10-CM

## 2022-10-11 DIAGNOSIS — E1165 Type 2 diabetes mellitus with hyperglycemia: Secondary | ICD-10-CM | POA: Diagnosis not present

## 2022-10-11 LAB — URINALYSIS, ROUTINE W REFLEX MICROSCOPIC
Bilirubin Urine: NEGATIVE
Glucose, UA: 50 mg/dL — AB
Ketones, ur: NEGATIVE mg/dL
Leukocytes,Ua: NEGATIVE
Nitrite: NEGATIVE
Protein, ur: NEGATIVE mg/dL
Specific Gravity, Urine: 1.013 (ref 1.005–1.030)
Squamous Epithelial / HPF: NONE SEEN (ref 0–5)
pH: 5 (ref 5.0–8.0)

## 2022-10-11 LAB — BASIC METABOLIC PANEL
Anion gap: 11 (ref 5–15)
BUN: 20 mg/dL (ref 8–23)
CO2: 21 mmol/L — ABNORMAL LOW (ref 22–32)
Calcium: 9.2 mg/dL (ref 8.9–10.3)
Chloride: 105 mmol/L (ref 98–111)
Creatinine, Ser: 1.1 mg/dL (ref 0.61–1.24)
GFR, Estimated: >60 mL/min (ref >60)
Glucose, Bld: 228 mg/dL — ABNORMAL HIGH (ref 70–99)
Potassium: 4.2 mmol/L (ref 3.5–5.1)
Sodium: 137 mmol/L (ref 135–145)

## 2022-10-11 LAB — CBC
HCT: 44.7 % (ref 39.0–52.0)
Hemoglobin: 14.8 g/dL (ref 13.0–17.0)
MCH: 31 pg (ref 26.0–34.0)
MCHC: 33.1 g/dL (ref 30.0–36.0)
MCV: 93.5 fL (ref 80.0–100.0)
Platelets: 179 10*3/uL (ref 150–400)
RBC: 4.78 MIL/uL (ref 4.22–5.81)
RDW: 13.2 % (ref 11.5–15.5)
WBC: 8.2 10*3/uL (ref 4.0–10.5)
nRBC: 0 % (ref 0.0–0.2)

## 2022-10-11 LAB — CBG MONITORING, ED: Glucose-Capillary: 205 mg/dL — ABNORMAL HIGH (ref 70–99)

## 2022-10-11 MED ORDER — SODIUM CHLORIDE 0.9 % IV BOLUS
500.0000 mL | Freq: Once | INTRAVENOUS | Status: AC
  Administered 2022-10-11: 500 mL via INTRAVENOUS

## 2022-10-11 NOTE — ED Provider Triage Note (Signed)
Emergency Medicine Provider Triage Evaluation Note  Mathew Wright , a 87 y.o. male  was evaluated in triage.  Pt complains of feeling more unsteady than usual. No chest pain/palpitations, shortness of breath, or headache.   Physical Exam  BP (!) 153/71 (BP Location: Left Arm)   Pulse 75   Temp 97.8 F (36.6 C) (Oral)   Resp 18   SpO2 100%  Gen:   Awake, no distress   Resp:  Normal effort  MSK:   Moves extremities without difficulty  Other:   Medical Decision Making  Medically screening exam initiated at 4:04 PM.  Appropriate orders placed.  Ziyan Schoon was informed that the remainder of the evaluation will be completed by another provider, this initial triage assessment does not replace that evaluation, and the importance of remaining in the ED until their evaluation is complete.  CBG 205   Victorino Dike, [place] 10/11/22 1610

## 2022-10-11 NOTE — ED Provider Notes (Signed)
-----------------------------------------   7:21 PM on 10/11/2022 -----------------------------------------  Blood pressure (!) 153/71, pulse 75, temperature 97.8 F (36.6 C), temperature source Oral, resp. rate 18, height 6' (1.829 m), weight 88 kg, SpO2 100 %.  Assuming care from Dr. Jacqualine Code.  In short, Mathew Wright is a 87 y.o. male with a chief complaint of Weakness .  Refer to the original H&P for additional details.  The current plan of care is to follow-up MRI results and urinalysis for acute onset balance difficulties.  ----------------------------------------- 10:29 PM on 10/11/2022 ----------------------------------------- Urinalysis shows no signs of infection and MRI is negative for stroke or other acute process.  On reassessment, patient is ambulate to ambulate with a cane, which is his baseline.  Family states that his ambulation is much improved from earlier and is agreeable to plan for discharge home with PCP follow-up later this week.  Patient and family counseled to have him return to the ED for new or worsening symptoms, patient agrees with plan.    Blake Divine, MD 10/11/22 2230

## 2022-10-11 NOTE — ED Provider Notes (Signed)
[facility] Provider Note    Event Date/Time   First MD Initiated Contact with Patient 10/11/22 1829     (approximate)   History   Weakness   HPI  Mathew Wright is a 87 y.o. male with a history of prediabetes managed with careful watching, also history of bladder cancer which is being monitored and recently had a cystoscopy couple weeks ago, essential tremor, fatty liver disease type 2 diabetes.    Patient reports he was in good health throughout the morning.  He went shopping [place] the outlet mall with his family.  He got home felt a little tired, rested when he got up at about noon he noticed that he just felt like his balance was very off.  He could not seem to get himself balance.  Feels okay when he is seated in a chair he does not have a spinning sensation did not feel like he is in a pass out but just feels like he is lost his center of balance.  He has noticed that he has been urinating a bit more frequently since after the time of his cystoscopy.  Still eating and drinking well.  No fevers or chills.  No abdominal pain no chest pain no trouble breathing history of stroke.  Family reports he has been speaking clearly acting normally.  No facial droop denies visual changes  Physical Exam   Triage Vital Signs: ED Triage Vitals  Enc Vitals Group     BP 10/11/22 1548 (!) 153/71     Pulse Rate 10/11/22 1548 75     Resp 10/11/22 1548 18     Temp 10/11/22 1548 97.8 F (36.6 C)     Temp Source 10/11/22 1548 Oral     SpO2 10/11/22 1548 100 %     Weight 10/11/22 1900 194 lb 0.1 oz (88 kg)     Height 10/11/22 1900 6' (1.829 m)     Head Circumference --      Peak Flow --      Pain Score 10/11/22 1603 0     Pain Loc --      Pain Edu? --      Excl. in [place]? --     Most recent vital signs: Vitals:   10/11/22 1548  BP: (!) 153/71  Pulse: 75  Resp: 18  Temp: 97.8 F (36.6 C)  SpO2: 100%     General: Awake, no distress.  Very pleasant without  distress.  Conversant seated in wheelchair. When I attempt to walk him, he starts to stand and obviously has difficulty with central balance.  Is not able to balance well even with slow and intentional rise from the chair. CV:  Good peripheral perfusion.  Normal heart tones and rate Resp:  Normal effort.  Clear bilaterally Abd:  No distention.  Soft nontender nondistended. Other:  Modified NIH exam performed score 0   ED Results / Procedures / Treatments   Labs (all labs ordered are listed, but only abnormal results are displayed) Labs Reviewed  BASIC METABOLIC PANEL - Abnormal; Notable for the following components:      Result Value   CO2 21 (*)    Glucose, Bld 228 (*)    All other components within normal limits  CBG MONITORING, ED - Abnormal; Notable for the following components:   Glucose-Capillary 205 (*)    All other components within normal limits  CBC  URINALYSIS, ROUTINE W REFLEX MICROSCOPIC   Labs interpreted as normal  CBC.  Normal metabolic panel with exception to elevated glucose of 228 the patient follows with his primary with regular A1c's and reports that right before he came in he had drank sweet tea  EKG  Reviewed inter by me at 1600 heart rate 75 QRS 70 QTc 430 Normal sinus rhythm, mild nonspecific T wave abnormality.  Morphology appears fairly similar to previous EKG.  I do not see evidence to suggest an acute ischemic change   RADIOLOGY Chest x-ray interpreted by me as negative for acute finding    PROCEDURES:  Critical Care performed: No  Procedures   MEDICATIONS ORDERED IN ED: Medications  sodium chloride 0.9 % bolus 500 mL (has no administration in time range)     IMPRESSION / MDM / ASSESSMENT AND PLAN / ED COURSE  I reviewed the triage vital signs and the nursing notes.                              Differential diagnosis includes, but is not limited to, possible dehydration, urinary tract infection, mild hyperglycemia, electrolyte  abnormality, central cause such as stroke, orthostatic hypotension, vertigo etc.  He has no acute cardiopulmonary symptoms.  His hemodynamics are normal with exception to slight hypertension.  He complains of no headache or focal neurologic symptoms except on examination he clearly has difficulty with balancing.  Will plan to hydrate, check urinalysis as well as the current labs, and MRI of the brain to exclude stroke.  Patient's presentation is most consistent with acute complicated illness / injury requiring diagnostic workup.  The patient is on the cardiac monitor to evaluate for evidence of arrhythmia and/or significant heart rate changes.  ----------------------------------------- 7:37 PM on 10/11/2022 ----------------------------------------- Ongoing care assigned to Dr. Charna Archer.  Follow-up on remaining reevaluation and testing including MRI of the brain to exclude central process stroke or mass lesion, urinalysis, and reevaluation after hydration and reassessment.     FINAL CLINICAL IMPRESSION(S) / ED DIAGNOSES   Final diagnoses:  Poor balance     Rx / DC Orders   ED Discharge Orders     None        Note:  This document was prepared using Dragon voice recognition software and may include unintentional dictation errors.   Delman Kitten, MD 10/11/22 [id number]

## 2022-10-11 NOTE — ED Triage Notes (Signed)
Pt to ED via POV from home. Pt reports increased weakness that started today. Pt reports he felt dizzy but denies syncope. Pt denies HA, CP, SOB. Pt denies medication changes.

## 2022-11-04 ENCOUNTER — Encounter: Payer: Self-pay | Admitting: Podiatry

## 2022-11-04 ENCOUNTER — Ambulatory Visit (INDEPENDENT_AMBULATORY_CARE_PROVIDER_SITE_OTHER): Payer: Medicare Other | Admitting: Podiatry

## 2022-11-04 DIAGNOSIS — M79676 Pain in unspecified toe(s): Secondary | ICD-10-CM

## 2022-11-04 DIAGNOSIS — E1149 Type 2 diabetes mellitus with other diabetic neurological complication: Secondary | ICD-10-CM | POA: Diagnosis not present

## 2022-11-04 DIAGNOSIS — B351 Tinea unguium: Secondary | ICD-10-CM

## 2022-11-04 NOTE — Progress Notes (Signed)
This patient returns to my office for at risk foot care.  This patient requires this care by a professional since this patient will be at risk due to having diabetes type 2.  This patient is unable to cut nails himself since the patient cannot reach his nails.These nails are painful walking and wearing shoes.  This patient presents for at risk foot care today.  General Appearance  Alert, conversant and in no acute stress.  Vascular  Dorsalis pedis and posterior tibial  pulses are palpable  bilaterally.  Capillary return is within normal limits  bilaterally. Temperature is within normal limits  bilaterally.  Neurologic  Senn-Weinstein monofilament wire test diminished  bilaterally. Muscle power within normal limits bilaterally.  Nails Thick disfigured discolored nails with subungual debris  from hallux to fifth toes bilaterally. No evidence of bacterial infection or drainage bilaterally.  Orthopedic  No limitations of motion  feet .  No crepitus or effusions noted.  No bony pathology or digital deformities noted.  Skin  normotropic skin with no porokeratosis noted bilaterally.  No signs of infections or ulcers noted.     Onychomycosis  Pain in right toes  Pain in left toes  Consent was obtained for treatment procedures.   Mechanical debridement of nails 1-5  bilaterally performed with a nail nipper.  Filed with dremel without incident.    Return office visit    3 months                  Told patient to return for periodic foot care and evaluation due to potential at risk complications.   Gardiner Barefoot DPM

## 2023-02-10 ENCOUNTER — Ambulatory Visit: Payer: Medicare Other | Admitting: Podiatry

## 2023-02-27 ENCOUNTER — Ambulatory Visit (INDEPENDENT_AMBULATORY_CARE_PROVIDER_SITE_OTHER): Payer: Medicare Other | Admitting: Podiatry

## 2023-02-27 ENCOUNTER — Encounter: Payer: Self-pay | Admitting: Podiatry

## 2023-02-27 VITALS — BP 122/53 | HR 86

## 2023-02-27 DIAGNOSIS — E1149 Type 2 diabetes mellitus with other diabetic neurological complication: Secondary | ICD-10-CM

## 2023-02-27 DIAGNOSIS — M79676 Pain in unspecified toe(s): Secondary | ICD-10-CM

## 2023-02-27 DIAGNOSIS — B351 Tinea unguium: Secondary | ICD-10-CM | POA: Diagnosis not present

## 2023-02-27 NOTE — Progress Notes (Signed)
This patient returns to my office for at risk foot care.  This patient requires this care by a professional since this patient will be at risk due to having diabetes type 2.  This patient is unable to cut nails himself since the patient cannot reach his nails.These nails are painful walking and wearing shoes.  This patient presents for at risk foot care today.  General Appearance  Alert, conversant and in no acute stress.  Vascular  Dorsalis pedis and posterior tibial  pulses are palpable  bilaterally.  Capillary return is within normal limits  bilaterally. Temperature is within normal limits  bilaterally.  Neurologic  Senn-Weinstein monofilament wire test diminished  bilaterally. Muscle power within normal limits bilaterally.  Nails Thick disfigured discolored nails with subungual debris  from hallux to fifth toes bilaterally. No evidence of bacterial infection or drainage bilaterally.  Orthopedic  No limitations of motion  feet .  No crepitus or effusions noted.  No bony pathology or digital deformities noted.  Skin  normotropic skin with no porokeratosis noted bilaterally.  No signs of infections or ulcers noted.     Onychomycosis  Pain in right toes  Pain in left toes  Consent was obtained for treatment procedures.   Mechanical debridement of nails 1-5  bilaterally performed with a nail nipper.  Filed with dremel without incident.    Return office visit    3 months                  Told patient to return for periodic foot care and evaluation due to potential at risk complications.   Chrishawn Kring DPM  

## 2023-03-16 ENCOUNTER — Other Ambulatory Visit
Admission: RE | Admit: 2023-03-16 | Discharge: 2023-03-16 | Disposition: A | Discharge status: Home / Self Care | Payer: Medicare Other | Source: Ambulatory Visit | Attending: Family Medicine | Admitting: Family Medicine

## 2023-03-16 DIAGNOSIS — J22 Unspecified acute lower respiratory infection: Secondary | ICD-10-CM | POA: Diagnosis present

## 2023-03-16 LAB — BASIC METABOLIC PANEL
Anion gap: 7 (ref 5–15)
BUN: 17 mg/dL (ref 8–23)
CO2: 25 mmol/L (ref 22–32)
Calcium: 8.4 mg/dL — ABNORMAL LOW (ref 8.9–10.3)
Chloride: 106 mmol/L (ref 98–111)
Creatinine, Ser: 1.15 mg/dL (ref 0.61–1.24)
GFR, Estimated: >60 mL/min (ref >60)
Glucose, Bld: 112 mg/dL — ABNORMAL HIGH (ref 70–99)
Potassium: 4 mmol/L (ref 3.5–5.1)
Sodium: 138 mmol/L (ref 135–145)

## 2023-06-02 ENCOUNTER — Ambulatory Visit: Payer: Medicare Other | Admitting: Podiatry

## 2023-06-05 ENCOUNTER — Ambulatory Visit (INDEPENDENT_AMBULATORY_CARE_PROVIDER_SITE_OTHER): Payer: Medicare Other | Admitting: Podiatry

## 2023-06-05 VITALS — BP 121/42

## 2023-06-05 DIAGNOSIS — B351 Tinea unguium: Secondary | ICD-10-CM | POA: Diagnosis not present

## 2023-06-05 DIAGNOSIS — E1149 Type 2 diabetes mellitus with other diabetic neurological complication: Secondary | ICD-10-CM | POA: Diagnosis not present

## 2023-06-05 DIAGNOSIS — M79676 Pain in unspecified toe(s): Secondary | ICD-10-CM | POA: Diagnosis not present

## 2023-06-05 DIAGNOSIS — E119 Type 2 diabetes mellitus without complications: Secondary | ICD-10-CM

## 2023-06-05 NOTE — Progress Notes (Unsigned)
Subjective: Lenward Able presents today for at risk foot care with history of diabetic neuropathy and painful elongated mycotic toenails 1-5 bilaterally which are tender when wearing enclosed shoe gear. Pain is relieved with periodic professional debridement. Chief Complaint  Patient presents with   Nail Problem    DFC,Referring Provider Elvia Collum, MD,lov:07/24,A1C:7.3      Elvia Collum, MD is patient's PCP.   Allergies  Allergen Reactions   Shellfish Allergy Nausea And Vomiting   Fish Allergy Nausea And Vomiting   Fish-Derived Products Nausea And Vomiting   Review of Systems: Negative except as noted in the HPI.   Objective: Vitals:   06/05/23 1317  BP: (!) 121/42    Datron Brakebill is a pleasant 88 y.o. male in NAD. AAO X 3.  Vascular Examination: Capillary refill time immediate b/l. Vascular status intact b/l with palpable pedal pulses. Pedal hair present b/l. No pain with calf compression b/l. Skin temperature gradient WNL b/l. No cyanosis or clubbing b/l. No ischemia or gangrene noted b/l.   Neurological Examination: Sensation grossly intact b/l with 10 gram monofilament. Vibratory sensation intact b/l.   Dermatological Examination: Pedal skin with normal turgor, texture and tone b/l.  No open wounds. No interdigital macerations.   Toenails 1-5 b/l thick, discolored, elongated with subungual debris and pain on dorsal palpation.   No corns, calluses nor porokeratotic lesions noted.  Musculoskeletal Examination: Muscle strength 5/5 to all lower extremity muscle groups bilaterally. No pain, crepitus or joint limitation noted with ROM bilateral LE. No gross bony deformities bilaterally.  Radiographs: None  Assessment: 1. Pain due to onychomycosis of toenail   2. Type II diabetes mellitus with neurological manifestations [facility])     Plan: Patient was evaluated and treated. All patient's and/or POA's questions/concerns addressed on today's visit. Toenails  1-5 debrided in length and girth without incident. Continue soft, supportive shoe gear daily. Report any pedal injuries to medical professional. Call office if there are any questions/concerns. -Continue foot and shoe inspections daily. Monitor blood glucose per PCP/Endocrinologist's recommendations. -Patient/POA to call should there be question/concern in the interim. Return in about 3 months (around 09/05/2023).  Freddie Breech, DPM

## 2023-06-12 ENCOUNTER — Encounter: Payer: Self-pay | Admitting: Podiatry

## 2023-09-08 ENCOUNTER — Ambulatory Visit (INDEPENDENT_AMBULATORY_CARE_PROVIDER_SITE_OTHER): Payer: Medicare Other | Admitting: Podiatry

## 2023-09-08 DIAGNOSIS — Z91199 Patient's noncompliance with other medical treatment and regimen due to unspecified reason: Secondary | ICD-10-CM

## 2023-09-08 NOTE — Progress Notes (Signed)
1. No-show for appointment     

## 2023-10-13 ENCOUNTER — Ambulatory Visit: Payer: Medicare Other | Admitting: Podiatry

## 2023-11-06 ENCOUNTER — Ambulatory Visit (INDEPENDENT_AMBULATORY_CARE_PROVIDER_SITE_OTHER): Payer: Medicare Other | Admitting: Podiatry

## 2023-11-06 ENCOUNTER — Encounter: Payer: Self-pay | Admitting: Podiatry

## 2023-11-06 VITALS — Ht 72.0 in | Wt 194.0 lb

## 2023-11-06 DIAGNOSIS — M79676 Pain in unspecified toe(s): Secondary | ICD-10-CM

## 2023-11-06 DIAGNOSIS — E1149 Type 2 diabetes mellitus with other diabetic neurological complication: Secondary | ICD-10-CM | POA: Diagnosis not present

## 2023-11-06 DIAGNOSIS — B351 Tinea unguium: Secondary | ICD-10-CM | POA: Diagnosis not present

## 2023-11-06 NOTE — Progress Notes (Signed)
Subjective: Mathew Wright presents today for at risk foot care with history of diabetic neuropathy and painful elongated mycotic toenails 1-5 bilaterally which are tender when wearing enclosed shoe gear. Pain is relieved with periodic professional debridement. Chief Complaint  Patient presents with   Nail Problem    RFC   Mathew Wright, Patton Salles, MD is patient's PCP.   Allergies  Allergen Reactions   Shellfish Allergy Nausea And Vomiting   Fish Allergy Nausea And Vomiting   Fish-Derived Products Nausea And Vomiting   Review of Systems: Negative except as noted in the HPI.   Objective: There were no vitals filed for this visit.   Mathew Wright is a pleasant 88 y.o. male in NAD. AAO X 3.  Vascular Examination: Capillary refill time immediate b/l. Vascular status intact b/l with palpable pedal pulses. Pedal hair present b/l. No pain with calf compression b/l. Skin temperature gradient WNL b/l. No cyanosis or clubbing b/l. No ischemia or gangrene noted b/l.   Neurological Examination: Sensation grossly intact b/l with 10 gram monofilament. Vibratory sensation intact b/l.   Dermatological Examination: Pedal skin with normal turgor, texture and tone b/l.  No open wounds. No interdigital macerations.   Toenails 1-5 b/l thick, discolored, elongated with subungual debris and pain on dorsal palpation.   No corns, calluses nor porokeratotic lesions noted.  Musculoskeletal Examination: Muscle strength 5/5 to all lower extremity muscle groups bilaterally. No pain, crepitus or joint limitation noted with ROM bilateral LE. No gross bony deformities bilaterally.  Radiographs: None  Assessment: No diagnosis found.   Plan: Patient was evaluated and treated. All patient's and/or POA's questions/concerns addressed on today's visit. Toenails 1-5 debrided in length and girth without incident. Continue soft, supportive shoe gear daily. Report any pedal injuries to medical professional. Call office  if there are any questions/concerns. -Continue foot and shoe inspections daily. Monitor blood glucose per PCP/Endocrinologist's recommendations. -Patient/POA to call should there be question/concern in the interim. No follow-ups on file.  Candelaria Stagers, DPM

## 2024-02-09 ENCOUNTER — Encounter: Payer: Self-pay | Admitting: Podiatry

## 2024-02-09 ENCOUNTER — Ambulatory Visit (INDEPENDENT_AMBULATORY_CARE_PROVIDER_SITE_OTHER): Payer: Medicare Other | Admitting: Podiatry

## 2024-02-09 VITALS — Ht 72.0 in | Wt 194.0 lb

## 2024-02-09 DIAGNOSIS — M2141 Flat foot [pes planus] (acquired), right foot: Secondary | ICD-10-CM

## 2024-02-09 DIAGNOSIS — M79676 Pain in unspecified toe(s): Secondary | ICD-10-CM

## 2024-02-09 DIAGNOSIS — M2142 Flat foot [pes planus] (acquired), left foot: Secondary | ICD-10-CM | POA: Diagnosis not present

## 2024-02-09 DIAGNOSIS — E119 Type 2 diabetes mellitus without complications: Secondary | ICD-10-CM

## 2024-02-09 DIAGNOSIS — B351 Tinea unguium: Secondary | ICD-10-CM | POA: Diagnosis not present

## 2024-02-09 DIAGNOSIS — E1149 Type 2 diabetes mellitus with other diabetic neurological complication: Secondary | ICD-10-CM

## 2024-02-09 NOTE — Progress Notes (Signed)
 ANNUAL DIABETIC FOOT EXAM  Subjective: Mathew Wright presents today for annual diabetic foot exam.  Chief Complaint  Patient presents with   Nail Problem    Pt is here for [facility] PCP is Dr [name] and LOV was in January.   Patient denies any h/o foot wounds.  Elvia Collum, MD is patient's PCP.  Past Medical History:  Diagnosis Date   Arthritis    Cancer (HCC) 2018   bladder   Dehydration 08/2017   was hospitalized for this at [facility]   Diabetes mellitus without complication (HCC)    HOH (hard of hearing)    wears hearing aids   Hypercholesteremia    Hypertension    Neuromuscular disorder (HCC)    tremors...takes inderal as needed   Pneumonia    bronchitis   Patient Active Problem List   Diagnosis Date Noted   HLD (hyperlipidemia) 12/15/2017   Hypogonadism male 12/15/2017   Bacterial overgrowth syndrome 08/25/2017   Hypotension due to hypovolemia 08/25/2017   Abnormal Romberg test 06/09/2017   At risk for falls 06/09/2017   Bladder cancer (HCC) 05/06/2017   Advanced directives, counseling/discussion 05/03/2016   Polycythemia 04/05/2013   BCC (basal cell carcinoma), face 03/03/2013   History of nonmelanoma skin cancer 01/26/2013   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   Posterior subcapsular polar senile cataract 07/19/2011   Vitreous degeneration 07/19/2011   Actinic keratosis 01/24/2011   Type II diabetes mellitus (HCC) 10/23/2010   Fatty liver disease, nonalcoholic 10/17/2009   Osteopenia 02/18/2008   Adenomatous polyp of colon 05/04/2007   Diverticulosis of colon 04/22/2000   Essential tremor 08/24/1996   Past Surgical History:  Procedure Laterality Date   CATARACT EXTRACTION W/PHACO Right 01/01/2018   Procedure: CATARACT EXTRACTION PHACO AND INTRAOCULAR LENS PLACEMENT (IOC);  Surgeon: Nevada Crane, MD;  Location: [facility] ORS;  Service: Ophthalmology;  Laterality: Right;  [place] 00:58.0 AP% 16.4 CDE 9.55 Fluid Pack Lot # [id number]    COLONOSCOPY     Current Outpatient Medications on File Prior to Visit  Medication Sig Dispense Refill   amLODipine (NORVASC) 10 MG tablet Take 10 mg by mouth daily.      aspirin 81 MG tablet Take 81 mg by mouth daily.     carbamazepine (TEGRETOL XR) 200 MG 12 hr tablet Take 1 tablet by mouth at bedtime for 2 weeks, then increase to 1 tablet by mouth twice a day     cyanocobalamin (,VITAMIN B-12,) 1000 MCG/ML injection Inject into the muscle.     D-Mannose 500 MG CAPS Take by mouth.     dorzolamide-timolol (COSOPT) 22.3-6.8 MG/ML ophthalmic solution Place 1 drop into both eyes 2 (two) times daily.      enalapril (VASOTEC) 10 MG tablet Take 1 tablet by mouth 2 (two) times daily.     finasteride (PROSCAR) 5 MG tablet Take 1 tablet by mouth daily.     fluticasone (FLONASE) 50 MCG/ACT nasal spray Place into the nose.     glipiZIDE (GLUCOTROL XL) 5 MG 24 hr tablet Take by mouth.     latanoprost (XALATAN) 0.005 % ophthalmic solution Place 1 drop in right eye at bedtime     metFORMIN (GLUCOPHAGE) 500 MG tablet Take by mouth 2 (two) times daily with a meal.     metoprolol succinate (TOPROL-XL) 100 MG 24 hr tablet Take 100 mg by mouth daily.      mirabegron ER (MYRBETRIQ) 25 MG TB24 tablet 1 pill by mouth prior to bed  Multiple Vitamins-Minerals (CENTRUM SILVER PO) Take 1 tablet by mouth daily.      Omega-3 Fatty Acids (FISH OIL) 1000 MG CAPS Take by mouth.     oxyCODONE (OXY IR/ROXICODONE) 5 MG immediate release tablet TAKE 1 TABLET BY MOUTH EVERY 4 HOURS AS NEEDED FOR UP TO 5 DAYS  0   primidone (MYSOLINE) 50 MG tablet Take 100-150 mg by mouth See admin instructions. Take 100 mg by mouth in the morning and take 150 mg by mouth at bedtime     propranolol (INDERAL) 10 MG tablet Take 10-20 mg by mouth 2 (two) times daily as needed (for tremors).      simvastatin (ZOCOR) 20 MG tablet Take 1 tablet by mouth daily.     sodium bicarbonate 650 MG tablet Take by mouth 2 (two) times daily.     tadalafil,  PAH, (ADCIRCA) 20 MG tablet Take 20 mg by mouth daily as needed.     tamsulosin (FLOMAX) 0.4 MG CAPS capsule Take by mouth.     Testosterone 75 MG PLLT Inject into the skin.     XIFAXAN 550 MG TABS tablet Take 550 mg by mouth 3 (three) times daily.  0   mometasone (NASONEX) 50 MCG/ACT nasal spray 2 sprays by Each Nare route daily.     No current facility-administered medications on file prior to visit.    Allergies  Allergen Reactions   Shellfish Allergy Nausea And Vomiting   Fish Allergy Nausea And Vomiting   Fish-Derived Products Nausea And Vomiting   Social History   Occupational History   Not on file  Tobacco Use   Smoking status: Former   Smokeless tobacco: Never   Tobacco comments:    quit  Vaping Use   Vaping status: Unknown  Substance and Sexual Activity   Alcohol use: No   Drug use: No   Sexual activity: Not on file   Family History  Problem Relation Age of Onset   Vision loss Mother    Stroke Mother    Aneurysm Father    Immunization History  Administered Date(s) Administered   Fluad Quad(high Dose 65+) 08/28/2021   Influenza, High Dose Seasonal PF 06/22/2016, 08/25/2018   Influenza, Seasonal, Injecte, Preservative Fre 10/03/2006, 08/26/2007, 09/01/2009, 07/24/2010, 11/05/2011   Influenza,inj,Quad PF,6+ Mos 08/02/2014, 08/27/2017, 07/27/2019, 08/07/2020   Influenza-Unspecified 08/25/2013, 07/08/2015, 06/22/2016, 08/25/2018   PFIZER(Purple Top)SARS-COV-2 Vaccination 11/24/2019, 12/18/2019, 08/07/2020   PPD Test 02/19/2008   Pfizer Covid-19 Vaccine Bivalent Booster 68yrs & up 08/28/2021   Pneumococcal Conjugate-13 04/14/2014   Pneumococcal Polysaccharide-23 12/09/2002   Tdap 07/24/2010   Zoster Recombinant(Shingrix) 09/07/2020, 01/22/2021   Zoster, Live 03/12/2007     Review of Systems: Negative except as noted in the HPI.   Objective: There were no vitals filed for this visit.  Mathew Wright is a pleasant 89 y.o. male in NAD. AAO X 3.  Diabetic  foot exam was performed with the following findings:   Vascular Examination: CFT <3 seconds b/l. DP/PT pulses faintly palpable b/l. Skin temperature gradient warm to warm b/l. No pain with calf compression. No ischemia or gangrene. No cyanosis or clubbing noted b/l. Pedal hair absent.   Neurological Examination: Protective sensation diminished with 10g monofilament b/l. Vibratory sensation decreased b/l.  Dermatological Examination: Pedal skin warm and supple b/l.   No open wounds. No interdigital macerations.  Toenails 1-5 b/l thick, discolored, elongated with subungual debris and pain on dorsal palpation.    No corns, calluses nor porokeratotic lesions noted.  Musculoskeletal Examination:  Muscle strength 5/5 to all lower extremity muscle groups bilaterally.  Radiographs: None     ADA Risk Categorization: High Risk  Patient has one or more of the following: Loss of protective sensation Absent pedal pulses Severe Foot deformity History of foot ulcer  Assessment: 1. Pain due to onychomycosis of toenail   2. Pes planus of both feet   3. Type II diabetes mellitus with neurological manifestations (HCC)   4. Encounter for diabetic foot exam (HCC)     Plan: Diabetic foot examination performed today. All patient's and/or POA's questions/concerns addressed on today's visit. Toenails 1-5 debrided in length and girth without incident. Continue foot and shoe inspections daily. Monitor blood glucose per PCP/Endocrinologist's recommendations. Continue soft, supportive shoe gear daily. Report any pedal injuries to medical professional. Call office if there are any questions/concerns. -Patient/POA to call should there be question/concern in the interim. Return in about 3 months (around 05/11/2024).  Freddie Breech, DPM      [facility] LOCATION: [address] [place] [address]                   Office [phone]    [facility] LOCATION: [address] [place], [place] [address] Office [phone]

## 2024-04-01 ENCOUNTER — Emergency Department

## 2024-04-01 ENCOUNTER — Emergency Department
Admission: EM | Admit: 2024-04-01 | Discharge: 2024-04-02 | Disposition: A | Discharge status: Other Acute Inpatient Hospital | Attending: Emergency Medicine | Admitting: Emergency Medicine

## 2024-04-01 ENCOUNTER — Other Ambulatory Visit: Payer: Self-pay

## 2024-04-01 DIAGNOSIS — I493 Ventricular premature depolarization: Secondary | ICD-10-CM | POA: Insufficient documentation

## 2024-04-01 DIAGNOSIS — I10 Essential (primary) hypertension: Secondary | ICD-10-CM | POA: Insufficient documentation

## 2024-04-01 DIAGNOSIS — E119 Type 2 diabetes mellitus without complications: Secondary | ICD-10-CM | POA: Diagnosis not present

## 2024-04-01 DIAGNOSIS — R079 Chest pain, unspecified: Secondary | ICD-10-CM | POA: Diagnosis present

## 2024-04-01 DIAGNOSIS — Z8551 Personal history of malignant neoplasm of bladder: Secondary | ICD-10-CM | POA: Insufficient documentation

## 2024-04-01 LAB — CBC
HCT: 41.3 % (ref 39.0–52.0)
Hemoglobin: 14.2 g/dL (ref 13.0–17.0)
MCH: 31.8 pg (ref 26.0–34.0)
MCHC: 34.4 g/dL (ref 30.0–36.0)
MCV: 92.6 fL (ref 80.0–100.0)
Platelets: 182 10*3/uL (ref 150–400)
RBC: 4.46 MIL/uL (ref 4.22–5.81)
RDW: 13.2 % (ref 11.5–15.5)
WBC: 11.5 10*3/uL — ABNORMAL HIGH (ref 4.0–10.5)
nRBC: 0 % (ref 0.0–0.2)

## 2024-04-01 LAB — TROPONIN I (HIGH SENSITIVITY)
Troponin I (High Sensitivity): 8 ng/L (ref <18)
Troponin I (High Sensitivity): 10 ng/L (ref <18)

## 2024-04-01 LAB — BASIC METABOLIC PANEL WITH GFR
Anion gap: 11 (ref 5–15)
BUN: 26 mg/dL — ABNORMAL HIGH (ref 8–23)
CO2: 23 mmol/L (ref 22–32)
Calcium: 8.5 mg/dL — ABNORMAL LOW (ref 8.9–10.3)
Chloride: 104 mmol/L (ref 98–111)
Creatinine, Ser: 0.98 mg/dL (ref 0.61–1.24)
GFR, Estimated: >60 mL/min (ref >60)
Glucose, Bld: 159 mg/dL — ABNORMAL HIGH (ref 70–99)
Potassium: 4.1 mmol/L (ref 3.5–5.1)
Sodium: 138 mmol/L (ref 135–145)

## 2024-04-01 LAB — MAGNESIUM: Magnesium: 2.2 mg/dL (ref 1.7–2.4)

## 2024-04-01 LAB — PHOSPHORUS: Phosphorus: 3.6 mg/dL (ref 2.5–4.6)

## 2024-04-01 LAB — TSH: TSH: 1.964 u[IU]/mL (ref 0.350–4.500)

## 2024-04-01 MED ORDER — TAMSULOSIN HCL 0.4 MG PO CAPS
0.4000 mg | ORAL_CAPSULE | Freq: Every day | ORAL | Status: DC
  Administered 2024-04-01 – 2024-04-02 (×2): 0.4 mg via ORAL
  Filled 2024-04-01 (×2): qty 1

## 2024-04-01 MED ORDER — PROSIGHT PO TABS
1.0000 | ORAL_TABLET | Freq: Every day | ORAL | Status: DC
  Administered 2024-04-02: 1 via ORAL
  Filled 2024-04-01: qty 1

## 2024-04-01 MED ORDER — PRIMIDONE 50 MG PO TABS
150.0000 mg | ORAL_TABLET | Freq: Every day | ORAL | Status: DC
  Filled 2024-04-01: qty 3

## 2024-04-01 MED ORDER — CRANBERRY 125 MG PO TABS: 1.0000 | ORAL_TABLET | Freq: Every day | ORAL | Status: DC

## 2024-04-01 MED ORDER — AMLODIPINE BESYLATE 5 MG PO TABS: 10.0000 mg | ORAL_TABLET | Freq: Every day | ORAL | Status: DC

## 2024-04-01 MED ORDER — PROPRANOLOL HCL 20 MG PO TABS
10.0000 mg | ORAL_TABLET | Freq: Two times a day (BID) | ORAL | Status: DC
Start: 2024-04-01 — End: 2024-04-03
  Administered 2024-04-01 – 2024-04-02 (×2): 20 mg via ORAL
  Filled 2024-04-01 (×2): qty 1

## 2024-04-01 MED ORDER — CIPROFLOXACIN HCL 500 MG PO TABS
500.0000 mg | ORAL_TABLET | Freq: Two times a day (BID) | ORAL | Status: DC
  Administered 2024-04-01 – 2024-04-02 (×2): 500 mg via ORAL
  Filled 2024-04-01 (×2): qty 1

## 2024-04-01 MED ORDER — ASPIRIN 81 MG PO TBEC: 81.0000 mg | DELAYED_RELEASE_TABLET | Freq: Every day | ORAL | Status: DC

## 2024-04-01 MED ORDER — DORZOLAMIDE HCL-TIMOLOL MAL 2-0.5 % OP SOLN
1.0000 [drp] | Freq: Two times a day (BID) | OPHTHALMIC | Status: DC
  Filled 2024-04-01: qty 10

## 2024-04-01 MED ORDER — SODIUM BICARBONATE 650 MG PO TABS: 650.0000 mg | ORAL_TABLET | Freq: Two times a day (BID) | ORAL | Status: DC

## 2024-04-01 MED ORDER — METFORMIN HCL 500 MG PO TABS: 500.0000 mg | ORAL_TABLET | Freq: Two times a day (BID) | ORAL | Status: DC

## 2024-04-01 MED ORDER — PRIMIDONE 50 MG PO TABS
100.0000 mg | ORAL_TABLET | Freq: Every day | ORAL | Status: DC
  Filled 2024-04-01: qty 2

## 2024-04-01 MED ORDER — GLIPIZIDE ER 5 MG PO TB24: 5.0000 mg | ORAL_TABLET | Freq: Every day | ORAL | Status: DC

## 2024-04-01 MED ORDER — SIMVASTATIN 10 MG PO TABS: 20.0000 mg | ORAL_TABLET | Freq: Every day | ORAL | Status: DC

## 2024-04-01 MED ORDER — LATANOPROST 0.005 % OP SOLN
1.0000 [drp] | Freq: Every day | OPHTHALMIC | Status: DC
  Filled 2024-04-01: qty 2.5

## 2024-04-01 MED ORDER — MIRABEGRON ER 25 MG PO TB24: 25.0000 mg | ORAL_TABLET | Freq: Every day | ORAL | Status: DC

## 2024-04-01 MED ORDER — CARBAMAZEPINE ER 200 MG PO TB12: 200.0000 mg | ORAL_TABLET | Freq: Two times a day (BID) | ORAL | Status: DC

## 2024-04-01 MED ORDER — RIFAXIMIN 550 MG PO TABS
550.0000 mg | ORAL_TABLET | Freq: Three times a day (TID) | ORAL | Status: DC
  Filled 2024-04-01: qty 1

## 2024-04-01 MED ORDER — FINASTERIDE 5 MG PO TABS
5.0000 mg | ORAL_TABLET | Freq: Every day | ORAL | Status: DC
  Filled 2024-04-01: qty 1

## 2024-04-01 MED ORDER — METOPROLOL SUCCINATE ER 50 MG PO TB24: 100.0000 mg | ORAL_TABLET | Freq: Every day | ORAL | Status: DC

## 2024-04-01 MED ORDER — ENALAPRIL MALEATE 10 MG PO TABS: 10.0000 mg | ORAL_TABLET | Freq: Two times a day (BID) | ORAL | Status: DC

## 2024-04-01 NOTE — ED Triage Notes (Signed)
 Pt BIB [facility] from home C/O chest pressure that woke him up from sleep around 0400. Pt reports he initially thought it was gas pain, but did not improve so he called EMS.

## 2024-04-01 NOTE — ED Notes (Signed)
 Called  [facility]  transfer  center  spoke  with  Edd Gong  pt still on  waitlist  for  a  bed informed MD

## 2024-04-01 NOTE — ED Notes (Signed)
 XRAY  [facility]  WITH  [facility]

## 2024-04-01 NOTE — ED Notes (Signed)
[facility]  TRANSFER  CENTER  CALLED  PER  DR  STAFFORD MD 

## 2024-04-01 NOTE — ED Notes (Signed)
 This tech changed pt after urinary incontinence. New chux pad and brief were applied. Pt resting in bed at this time with call bell in reach.

## 2024-04-01 NOTE — ED Notes (Signed)
 Pt called out requesting additional blankets. Provided with same.

## 2024-04-01 NOTE — ED Provider Notes (Signed)
 [facility] Provider Note    Event Date/Time   First MD Initiated Contact with Patient 04/01/24 [phone]     (approximate)   History   Chief Complaint: Chest Pain   HPI  Mathew Wright is a 89 y.o. male with a history of bladder cancer, hypertension, hyperlipidemia, diabetes, tremor who comes to the ED due to chest pain that woke him up at 4:00 AM today.  Central, radiating to the jaw, described as tightness, associated with shortness of breath.  No vomiting or diaphoresis.  No aggravating or alleviating factors.  Nonpleuritic.  Feels somewhat improved currently after receiving nitroglycerin spray and aspirin 324 by EMS.        Past Medical History:  Diagnosis Date   Arthritis    Cancer (HCC) 2018   bladder   Dehydration 08/2017   was hospitalized for this at [facility]   Diabetes mellitus without complication (HCC)    HOH (hard of hearing)    wears hearing aids   Hypercholesteremia    Hypertension    Neuromuscular disorder (HCC)    tremors...takes inderal as needed   Pneumonia    bronchitis    Current Outpatient Rx   Order #: [id number] Class: Historical Med   Order #: [id number] Class: Historical Med   Order #: [id number] Class: Historical Med   Order #: [id number] Class: Historical Med   Order #: [id number] Class: Historical Med   Order #: [id number] Class: Historical Med   Order #: [id number] Class: Historical Med   Order #: [id number] Class: Historical Med   Order #: [id number] Class: Historical Med   Order #: [id number] Class: Historical Med   Order #: [id number] Class: Historical Med   Order #: [id number] Class: Historical Med   Order #: [id number] Class: Historical Med   Order #: [id number] Class: Historical Med   Order #: [id number] Class: Historical Med   Order #: [id number] Class: Historical Med   Order #: [id number] Class: Historical Med   Order #: [id number] Class: Historical Med   Order #: [id number] Class: Historical Med   Order #: [id number] Class: Historical Med    Order #: [id number] Class: Historical Med   Order #: [id number] Class: Historical Med   Order #: [id number] Class: Historical Med   Order #: [id number] Class: Historical Med   Order #: [id number] Class: Historical Med   Order #: [id number] Class: Historical Med    Past Surgical History:  Procedure Laterality Date   CATARACT EXTRACTION W/PHACO Right 01/01/2018   Procedure: CATARACT EXTRACTION PHACO AND INTRAOCULAR LENS PLACEMENT (IOC);  Surgeon: Rosa College, MD;  Location: [facility] ORS;  Service: Ophthalmology;  Laterality: Right;  US  00:58.0 AP% 16.4 CDE 9.55 Fluid Pack Lot # [id number]   COLONOSCOPY      Physical Exam   Triage Vital Signs: ED Triage Vitals  Encounter Vitals Group     BP 04/01/24 0821 (!) 157/58     Systolic BP Percentile --      Diastolic BP Percentile --      Pulse Rate 04/01/24 0821 92     Resp 04/01/24 0821 20     Temp 04/01/24 0821 98.1 F (36.7 C)     Temp Source 04/01/24 0821 Oral     SpO2 04/01/24 0821 98 %     Weight 04/01/24 0822 180 lb (81.6 kg)     Height 04/01/24 0822 6' (1.829 m)     Head Circumference --      Peak Flow --      Pain Score 04/01/24 0822 2     Pain Loc --      Pain Education --  Exclude from Growth Chart --     Most recent vital signs: Vitals:   04/01/24 1030 04/01/24 1100  BP: (!) 152/59 130/89  Pulse: 89 96  Resp: 17 17  Temp:    SpO2: 100% 100%    General: Awake, no distress.  CV:  Good peripheral perfusion.  Irregular rhythm, normal distal pulses Resp:  Normal effort.  Clear to auscultation bilaterally Abd:  No distention.  Soft nontender Other:  No lower extremity edema   ED Results / Procedures / Treatments   Labs (all labs ordered are listed, but only abnormal results are displayed) Labs Reviewed  CBC - Abnormal; Notable for the following components:      Result Value   WBC 11.5 (*)    All other components within normal limits  BASIC METABOLIC PANEL WITH GFR - Abnormal; Notable for the following  components:   Glucose, Bld 159 (*)    BUN 26 (*)    Calcium 8.5 (*)    All other components within normal limits  MAGNESIUM  PHOSPHORUS  TSH  TROPONIN I (HIGH SENSITIVITY)  TROPONIN I (HIGH SENSITIVITY)     EKG Interpreted by me Sinus rhythm, rate of 85, ventricular ectopy in a trigeminy pattern.  Normal axis, normal intervals.  Poor R wave progression.  No acute ischemic changes.   RADIOLOGY Chest x-ray interpreted by me, unremarkable.  Radiology report reviewed   PROCEDURES:  Procedures   MEDICATIONS ORDERED IN ED: Medications - No data to display   IMPRESSION / MDM / ASSESSMENT AND PLAN / ED COURSE  I reviewed the triage vital signs and the nursing notes.  DDx: Non-STEMI, pneumonia, pneumothorax, pleural effusion, pulmonary edema, arrhythmia  Patient's presentation is most consistent with acute presentation with potential threat to life or bodily function.     Clinical Course as of 04/01/24 1247  Thu Apr 01, 2024  [id number] 89 year old male presents with central chest pain described as tightness, radiating to the jaw, associated with shortness of breath, woke him up at 4:00 AM today.  Monitor shows frequent ectopy.  Will check labs, give sublingual nitroglycerin, check chest x-ray, plan to admit for high risk chest pain. [PS]  1129 Pt and family request transfer to [facility] for continuity of care. Will attempt to arrange. [PS]  1245 Discussed with [facility] cardiology, they accept patient under service attending Leartis Proud [PS]    Clinical Course User Index [PS] Jacquie Maudlin, MD     FINAL CLINICAL IMPRESSION(S) / ED DIAGNOSES   Final diagnoses:  Chest pain with moderate risk for cardiac etiology  Ventricular ectopy  Type 2 diabetes mellitus without complication, without long-term current use of insulin (HCC)     Rx / DC Orders   ED Discharge Orders     None        Note:  This document was prepared using Dragon voice recognition software and may  include unintentional dictation errors.   Jacquie Maudlin, MD 04/01/24 1247

## 2024-04-01 NOTE — ED Notes (Signed)
 Transferred patient from ED stretcher onto hospital bed. Brief clean and dry. Sacral mepilex applied. Redness noted to buttocks. External catheter remains in place. Daughter at bedside.

## 2024-04-02 DIAGNOSIS — I493 Ventricular premature depolarization: Secondary | ICD-10-CM | POA: Diagnosis not present

## 2024-04-02 NOTE — ED Notes (Signed)
 EMTALA reviewed by Pattie Borders, RN

## 2024-04-02 NOTE — ED Notes (Signed)
 Called  [facility] transfer center spoke with Amy patient still on wait list for a bed

## 2024-04-02 NOTE — ED Notes (Signed)
Report called to [facility]. 

## 2024-04-02 NOTE — ED Notes (Signed)
 [facility] said they did not have a truck to transport /care link was called spoke with Ruby patient is on the wait list for transport

## 2024-04-02 NOTE — ED Notes (Signed)
 Transfer consent signed by patient and witnessed by this RN.

## 2024-04-02 NOTE — ED Notes (Signed)
[place] reviewed

## 2024-04-02 NOTE — ED Notes (Signed)
Pt resting with eyes closed. Respirations even non labored.

## 2024-04-02 NOTE — ED Notes (Signed)
 [facility] called with a bed assignment [address] room 3720 call report [phone] opt 2 spoke with stephanie ask if they can transport when report is called

## 2024-05-13 ENCOUNTER — Encounter: Payer: Self-pay | Admitting: Podiatry

## 2024-05-13 ENCOUNTER — Ambulatory Visit (INDEPENDENT_AMBULATORY_CARE_PROVIDER_SITE_OTHER): Admitting: Podiatry

## 2024-05-13 DIAGNOSIS — M79676 Pain in unspecified toe(s): Secondary | ICD-10-CM | POA: Diagnosis not present

## 2024-05-13 DIAGNOSIS — E1149 Type 2 diabetes mellitus with other diabetic neurological complication: Secondary | ICD-10-CM | POA: Diagnosis not present

## 2024-05-13 DIAGNOSIS — B351 Tinea unguium: Secondary | ICD-10-CM | POA: Diagnosis not present

## 2024-05-16 NOTE — Progress Notes (Signed)
  Subjective:  Patient ID: Mathew Wright, male    DOB: [birth year],  MRN: [id number]  Mathew Wright presents to clinic today for at risk foot care with history of diabetic neuropathy and painful mycotic toenails of both feet that are difficult to trim. Pain interferes with daily activities and wearing enclosed shoe gear comfortably. He is accompanied by caregiver, Jon, on today's visit. Chief Complaint  Patient presents with   Nail Problem    Thick painful toenails, 3 month follow up   New problem(s): None.   PCP is Manuela Sharrell LABOR, MD. ARNETTA 03/08/2024.  Allergies  Allergen Reactions   Shellfish Allergy Nausea And Vomiting   Fish Allergy Nausea And Vomiting   Fish-Derived Products Nausea And Vomiting    Review of Systems: Negative except as noted in the HPI.  Objective: No changes noted in today's physical examination. There were no vitals filed for this visit. Mathew Wright is a pleasant 89 y.o. male in NAD. AAO x 3.  Vascular Examination: Capillary refill time immediate b/l. Palpable pedal pulses. Pedal hair sparse b/l. No pain with calf compression b/l. Skin temperature gradient WNL b/l. No cyanosis or clubbing b/l. No ischemia or gangrene noted b/l. No edema noted b/l LE.  Neurological Examination: Sensation grossly intact b/l with 10 gram monofilament. Vibratory sensation decreased b/l.  Dermatological Examination: Pedal skin with normal turgor, texture and tone b/l.  No open wounds. No interdigital macerations.   Toenails 1-5 b/l thick, discolored, elongated with subungual debris and pain on dorsal palpation.   No hyperkeratotic nor porokeratotic lesions present on today's visit.  Musculoskeletal Examination: Normal muscle strength 5/5 to all lower extremity muscle groups bilaterally. No pain, crepitus or joint limitation noted with ROM b/l LE. No gross bony pedal deformities b/l. Patient ambulates with rollator assistance.  Radiographs:  None  Assessment/Plan: 1. Pain due to onychomycosis of toenail   2. Type II diabetes mellitus with neurological manifestations [facility])   Patient was evaluated and treated. All patient's and/or POA's questions/concerns addressed on today's visit. Mycotic toenails 1-5 debrided in length and girth without incident.  Continue daily foot inspections and monitor blood glucose per PCP/Endocrinologist's recommendations.Continue soft, supportive shoe gear daily. Report any pedal injuries to medical professional. Call office if there are any quesitons/concerns. -Patient/POA to call should there be question/concern in the interim.   Return in about 3 months (around 08/13/2024).  Delon LITTIE Merlin, DPM       LOCATION: [address], [place] [address]                   Office [phone]   [facility] LOCATION: [address] [place], [place] [address] Office [phone]

## 2024-08-20 ENCOUNTER — Ambulatory Visit: Admitting: Podiatry

## 2024-08-20 DIAGNOSIS — E1149 Type 2 diabetes mellitus with other diabetic neurological complication: Secondary | ICD-10-CM

## 2024-08-20 DIAGNOSIS — B351 Tinea unguium: Secondary | ICD-10-CM | POA: Diagnosis not present

## 2024-08-20 DIAGNOSIS — M79676 Pain in unspecified toe(s): Secondary | ICD-10-CM | POA: Diagnosis not present

## 2024-08-24 ENCOUNTER — Encounter: Payer: Self-pay | Admitting: Podiatry

## 2024-08-24 NOTE — Progress Notes (Signed)
  Subjective:  Patient ID: Mathew Wright, male    DOB: 1935-01-29,  MRN: [id number]  Mathew Wright presents to clinic today for at risk foot care with history of diabetic neuropathy and painful thick toenails that are difficult to trim. Pain interferes with ambulation. Aggravating factors include wearing enclosed shoe gear. Pain is relieved with periodic professional debridement. He is accompanied by his wife on today's visit. Chief Complaint  Patient presents with   Toe Pain    RFC. Dr. CHRISTELLA. Mathew is his PCP.   New problem(s): None.   PCP is Mathew Wright LABOR, MD. ARNETTA 07/05/2024.  Allergies  Allergen Reactions   Shellfish Allergy Nausea And Vomiting   Fish Allergy Nausea And Vomiting   Fish-Derived Products Nausea And Vomiting    Review of Systems: Negative except as noted in the HPI.  Objective: No changes noted in today's physical examination. There were no vitals filed for this visit. Mathew Wright is a pleasant 89 y.o. male in NAD. AAO x 3.  Vascular Examination: Capillary refill time immediate b/l. Palpable pedal pulses. Pedal hair sparse b/l. No pain with calf compression b/l. Skin temperature gradient WNL b/l. No cyanosis or clubbing b/l. No ischemia or gangrene noted b/l. No edema noted b/l LE.  Neurological Examination: Sensation grossly intact b/l with 10 gram monofilament. Vibratory sensation decreased b/l.  Dermatological Examination: Pedal skin with normal turgor, texture and tone b/l.  No open wounds. No interdigital macerations.   Toenails 1-5 b/l thick, discolored, elongated with subungual debris and pain on dorsal palpation.   No hyperkeratotic nor porokeratotic lesions present on today's visit.  Musculoskeletal Examination: Normal muscle strength 5/5 to all lower extremity muscle groups bilaterally. No pain, crepitus or joint limitation noted with ROM b/l LE. No gross bony pedal deformities b/l. Patient ambulates with rollator assistance.  Radiographs:  None . Assessment/Plan: 1. Pain due to onychomycosis of toenail   2. Type II diabetes mellitus with neurological manifestations [facility])   Consent given for treatment. Patient examined. All patient's and/or POA's questions/concerns addressed on today's visit. Toenails 1-5 debrided in length and girth without incident. Continue foot and shoe inspections daily. Monitor blood glucose per PCP/Endocrinologist's recommendations. Continue soft, supportive shoe gear daily. Report any pedal injuries to medical professional. Call office if there are any questions/concerns. -Patient/POA to call should there be question/concern in the interim.   Return in about 3 months (around 11/20/2024).  Mathew Wright, DPM      [place] LOCATION: [address] [place] [address]                   Office [phone]   [facility] LOCATION: [address] [place], [place] [address] Office [phone]

## 2024-10-19 NOTE — Consults (Signed)
 ------------------------------------------------------------------------------- Attestation signed by Drusilla Belvie Sharper, MD at 10/19/24 1344 (Updated) I saw and evaluated the patient, participating in the key portions of the service.  I reviewed the resident's note.  I agree with the resident's findings and plan.   89yo male with sepsis POA due to urinary source I/s/o prior bladder cancer. Unclear if urinary retention was initial trigger with subsequent infection v infection caused urinary retention. Was more altered overnight needing CT head, which showed a subacute subdural hematoma. Plan to repeat in 48 hours or for clinical change. Clinically improved this morning, continue IV antibiotics. Consult urology given urinary retention with prior urologic history.   Also with intermittent RVR (90s up to 120s), although asymptomatic and maintaining BP. Received home metoprolol  this morning at 1130. Will also give IVF, as he appears volume down. Otherwise, suspect this should continue to improve in the next 24 hours.  Belvie Sharper Drusilla, MD  -------------------------------------------------------------------------------  Medicine Care Advancement Team [facility]) Initial Consult Note   Assessment and Recommendations:  Mathew Wright is a 89 y.o. male who is presenting to [facility] with Acute cystitis, in the setting of the following pertinent/contributing co-morbidities per below. Pt was seen at the request of Smeet Mennie Roads, DO (Emergency Medicine)  Principal Problem:   Acute cystitis Active Problems:   Type II diabetes mellitus (CMS-HCC)   Essential tremor   Hypercholesterolemia   HTN (hypertension)   At risk for falls   Malignant neoplasm of urinary bladder (CMS-HCC)   Hodgkin's lymphoma    (CMS-HCC)   Atrial fibrillation    (CMS-HCC)   Sepsis    (CMS-HCC)  Active Problems  #Sepsis, presumed due to urinary tract infection #Fall at home On presentation, pt tachycardic in  RVR, Febrile 100.7, no hypotension on RA.  Received sepsis fluids and initiation of broad-spectrum IV antibiotics in the emergency department.  Leukocytosis significant 18, lactate elevated 2.0. RPP negative, GI PP negative, C. difficile negative.  UA consistent with bacteria and significant LE. CT chest with possible bronchitis although RPP negative. CTAP with bilateral hydro and inflammatory stranding of the bladder c/w UTI. Most recent ucx with pan-sensitive e coli. Clinically improving after IV abx and IVF administration. Thus will continue to treat broadly with Zosyn. Deferred vancomycin given no history of MRSA and no indwelling lines/catheters.  - start zosyn (c/p cefepime in ED) - azithromycin x3 doses to treat for PNA although lower suspicion for this - give additional 500cc IVF  - Follow up infectious workup: Urine culture, blood culture x 2 - Daily CBC, BMP, HFP, Mag  - continuous telemetry monitoring  - continuous O2 monitoring  #AKI #Recurrent Hematuria  #Hx Bladder cancer #New bilateral hydronephrosis without ureteral obstruction Patient is having intermittent recurrent hematuria since at least August for which she was hospitalized last month. Hemoglobin on admission down trended from baseline 12-13 to 10. Prior baseline creatinine less than 1, 2.0 on presentation.  CT abdomen pelvis concerning for underlying chronic bladder outlet obstruction with superimposed cystitis. Correlates with abnormal urinalysis per above.  Broad-spectrum antibiotics initiated, trend hemoglobin during admission. Foley catheter placed in ED with clear urine but purulence present.  - consider urology consult  - Trend h/h daily - maintain indwelling foley catheter - continue myrbetriq  50mg  daily  Confusion, AMS Patient presented altered from baseline. Unclear if this is delirium driven by sepsis/acute infection or if another pathology is underlying. Given recent fall and that he is on full dose  anticoagulation, will obtain CT head without contrast.  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 Follows with Dr Kingsley. Has achieved a CR with chemotherapy, treatment has since been discontinued. Concern for longstanding chemo-induced diarrhea which has been intermittent, acutely worsened, but largely ongoing since his diagnosis. Infectious panel negative.  #T2DM Glucose Elevated on Admission. No home regimen. A1c 8.8%. Will cover with sliding scale while admitted and optimize regimen prior to discharge. - SSI during admission   #Paroxysmal Atrial Fibrillation Follows with [facility]. New diagnosis after he went into A-fib during left heart catheterization 03/2024.  In A-fib on admission, asymptomatic and rate controlled on 25 mg metoprolol  daily.  - Eliquis 5mg  BID, on hold - Continue metoprolol  for heart rate control  #Intermittent PVCs/nonsustained VT Noted on monitor during his hospitalization. Given acute recent fall, continuous telemetry during admission and consider repeat   -Continue metoprolol  25mg     Chronic Problems #Mild aortic insufficiency Noted on echocardiography 03/2024, normal LVEF.  No specific therapies indicated. #Nonobstructive Coronary Artery Disease: formulary equivalent of home statin #Hyperlipidemia Nonobstructive coronary artery disease with 60-70% right-sided PDA lesion, no intervention needed.  - Continue rosuvastatin for cholesterol management. #Essential Tremor: continue primidone  150mg  TID  Checklist: Diet: Regular Diet DVT PPx: Contraindicated - High Risk for Bleeding/Active Bleeding Code Status: Full Code Dispo: Patient appropriate for Inpatient based on expectation of ongoing need for hospitalization greater than two midnights based on severity of presentation/services including IV antibiotics iso urosepsis  Target Team:  DestinationTeam: Teaching Team CCM Communication:  [ ]  Needs DME (See PT/OT Notes) [ ]  Needs Home Infusion [ ]  Needs Referral to SNF/ALF   For questions between 7:30AM-5PM, please page the MCAT resident pager at [phone]. After 5:30 PM, the MCAT service is covered by the [facility] On-Call Resident [phone]) for urgent/emergent questions or concerns.    Significant Comorbid Conditions <redacted file path>: -Anemia POA requiring further investigation or monitoring -Chronic kidney disease POA requiring further investigation, treatment, or monitoring -Age related debility POA requiring additional resources: DME, PT, or OT -Dehydration POA requiring further investigation, treatment, or monitoring  Issues Impacting Complexity of Management: <redacted file path> -Intensive monitoring of drug toxicity from Zosyn with scheduled BMP -High risk of complications from pain and/or analgesia likely to result in delirium -The patient is at high risk from Hospital immobility in an elderly patient given baseline poor functional status with a high risk of causing delirium and further decline in function -Need for the following intensive monitoring parameter(s) due to high risk of clinical decline: continuous oxygen monitoring and telemetry  I personally spent greater than 90 minutes face-to-face and non-face-to-face in the care of this patient, which includes all pre, intra, and post visit time on the date of service.  All documented time was specific to the E/M visit and does not include any procedures that may have been performed.   Subjective:  Mathew Wright is a 89 y.o. male with pertinent PMHx of diabetes mellitus, hypertension, history of bacterial overgrowth, bladder cancer status post therapy and recurrence, BPH, essential tremor and newly diagnosed with CD30 positive lymphoma, favoring classic Hodgkin's lymphoma, stage IIIb who was directed to the emergency department following PCP appointment for  low blood pressures at home and recent fall at home.   HPI: Per review of PCP of appointment 12/1, patient reported two days ago, he felt weak and subsequently fell in the bathroom but did not hit his head.  No LOC. He was lying for what he estimates 90 minutes before his friend, Mathew Wright, found him on the ground and she called EMS.  They came and wanted to take him to the local [facility] but he declined. Since then, reports he has continued to feel weak and has has significant increased thirst and urination.  Denied cough, fevers, chest pain nor vomiting, diarrhea baseline for him. Per home records, pt BP pressures 104/67 pulse 142 on 10/17/2024 blood sugars are 296, on 11/29 heart rate 124, BP 141/71, on 10/15/2024, BP 130/70, pulse 109 glucose 396 decreasing to 301 in the afternoon.  Other readings for the last week have been 120s-131/54/79.  ED Course:   Vitals:febrile to 38.2, BP 118/65, HR: 92, satting 95% on RA  Labs:  CBC notable for WBC 18, hgb 10.3 (Baseline 12-13) CMP notable for Na 134, Cr 2.02 (Baseline 0.8-0.9), AST 49/ALT 53/alk phos 119, normal t bili 0.4 A1c 8.8%, glucose 276 Lactate 2.0 Micro: UA with large leuks, negative nitrites, >182 WBC, large blood, 102 RBC, many bacteria. RPP negative. EKG: afib with RVR with rates 130s. Imaging: - CT chest: Bronchial wall thickening with mucoid impaction, which can be seen with bronchitis. No consolidative opacity.  - CT A/P: New moderate bilateral hydroureteronephrosis without evidence of ureteral obstruction. Underlying circumferential bladder wall thickening and perivesicular inflammatory stranding may suggest acute on chronic outlet obstruction with superimposed cystitis.  Interventions:  Cefe/vanc 1.5 fluid bolus   Allergies Shellfish containing products and Fish containing products  I reviewed the Medication List. The current list is Accurate Prior to Admission medications  Medication Dose, Route,  Frequency  acetaminophen (TYLENOL) 500 MG tablet 1,000 mg, Every 6 hours PRN  antiox #8/om3/dha/epa/lut/zeax (PRESERVISION AREDS 2, OMEGA-3, ORAL) 1 tablet, Daily (standard)  apixaban (ELIQUIS) 5 mg Tab 5 mg, Oral, 2 times a day (standard)  blood sugar diagnostic (ACCU-CHEK GUIDE TEST STRIPS) Strp USE TO CHECK BLOOD SUGAR ONCE DAILY  CONSTULOSE 10 gram/15 mL solution   cranberry fruit (CRANBERRY) 450 mg Tab 1 tablet, Daily (standard)  cyanocobalamin, vitamin B-12, 1,000 mcg/mL injection 1 mL, Intramuscular, Every 30 days  dorzolamide -timolol  (COSOPT ) 22.3-6.8 mg/mL ophthalmic solution 2 times a day (standard)  finasteride  (PROSCAR ) 5 mg tablet 5 mg, Oral, Daily (standard)  fluticasone propionate (FLONASE) 50 mcg/actuation nasal spray 1 spray, Each Nare, Daily (standard)  lancets Misc Type 2 diabetes E11.9Test 1 time daily  latanoprost  (XALATAN ) 0.005 % ophthalmic solution 1 drop, Nightly  metoPROLOL  succinate (TOPROL -XL) 25 MG 24 hr tablet 25 mg, Oral, Daily (standard)  mirabegron  (MYRBETRIQ ) 50 mg Tb24 extended-release tablets 50 mg, Oral, Daily (standard)  polyethylene glycol (GLYCOLAX) 17 gram/dose powder Take one dose four times a day as directed Patient taking differently: Take 17 g by mouth daily as needed (Constipation). Take one dose four times a day as directed  primidone  (MYSOLINE ) 50 MG tablet 150 mg, Oral, 3 times a day (standard)  rosuvastatin (CRESTOR) 20 MG tablet 20 mg, Oral, Nightly  syringe with needle 3 mL 22 x 1 1/2 Syrg Use 1 syringe with needle monthly or as needed.  tamsulosin  (FLOMAX ) 0.4 mg capsule 0.4 mg, Oral, 2 times a day (standard)    Librarian, Academic: Mathew Wright currently lacks decisional capacity for healthcare decision-making and is unable to designate a surrogate healthcare decision maker. Mathew Wright designated healthcare decision maker(s) is/are Mathew Wright (the patient's adult child) as denoted by hospital policy for patients  without a known preference.  Objective:  Physical Exam: Temp:  [35.6 C (96 F)-38.2 C (100.7 F)] 38.2 C (100.7 F) Pulse:  [65-134] 88 SpO2 Pulse:  [80-115] 89 Resp:  [14-28] 21 BP: (118-164)/(53-130) 130/57 SpO2:  [90 %-99 %] 92 %  GEN: confused, chronically ill appearing gentleman in mild distress  ENT: oropharynx clear, dry mucous membranes CV: tachycardic rate and irregular rhythm. Extremities warm.  PULM: Clear to auscultation bilaterally in anterior fields ABD: Soft, tender in LLQ and non-distended. Hyperactive bowel sounds.  EXT: No edema, warm. NEURO/PSYCH: Alert, disoriented. Very hard of hearing and difficulty answering questions.   Labs/Studies: Labs and Studies from the last 24hrs per EMR and Reviewed  Imaging: Radiology studies were personally reviewed

## 2024-10-20 NOTE — Progress Notes (Signed)
 Medicine Daily Progress Note  Assessment/Plan: Principal Problem:   Acute cystitis Active Problems:   Type II diabetes mellitus (CMS-HCC)   Essential tremor   Hypercholesterolemia   HTN (hypertension)   At risk for falls   Malignant neoplasm of urinary bladder (CMS-HCC)   Hodgkin's lymphoma    (CMS-HCC)   Atrial fibrillation    (CMS-HCC)   Sepsis    (CMS-HCC)      Mathew Wright is an 89 y.o. man with PMH HTN, bladder cancer, Hodgkin's lymphoma, T2DM, atrial fibrillation, urosepsis and AKI. CT findings concerning for chronic bladder outlet obstruction with secondary cystitis.  #Sepsis due to urinary tract infection #Fall at home On presentation, pt tachycardic in RVR, Febrile 100.7, no hypotension, breathing well on room air.  Leukocytosis significant 18, lactate elevated 2.0. Received sepsis fluids and initiation of broad-spectrum IV antibiotics in the emergency department.  RPP negative, GI PP negative, C. difficile negative.  UA consistent with UTI. CT chest with possible bronchitis although RPP negative. CTAP with bilateral hydro and inflammatory stranding of the bladder c/w UTI. Urine culture grew Enterobacter cloacae R to Augmentin and Cefazolin and Nitrofurantoin, S to Zosyn, Cefepime, Cipro , Bactrim. Clinically improving after IV abx and IVF administration.  - Continue zosyn (12/1- ) - S/p azithromycin x3 doses to treat for PNA although lower suspicion for this - Follow up blood cultures - Daily CBC   #AKI with post-ATN diuresis #Hx Bladder cancer #New bilateral hydronephrosis without ureteral obstruction concerning for bladder outlet obstruction Patient is having intermittent recurrent hematuria since at least August for which she was hospitalized last month. Hemoglobin on admission down trended from baseline 12-13 to 10 likely due to dilution. Hb now stabilized. Creatinine 2.0 on presentation up from baseline Cr 1.  CT abdomen pelvis showed bilateral hydronephrosis with  no ureteral obstruction concerning for underlying chronic bladder outlet obstruction with superimposed cystitis. Correlates with abnormal urinalysis per above.  Foley catheter placed in ED with purulent urine. UO 3 L over the 24 hours after foley placement consistent with post-ATN diuresis. - Urology following - Foley placed 12/1 - Repeat renal US  pending to assess for resolution of hydronephrosis with foley placement - Monitor UO - Monitor BID potassium given post-ATN diuresis - maintain indwelling foley catheter - continue myrbetriq  50 mg daily   Delirium due to infection Patient presented with altered mentation from baseline. Unclear if this is delirium driven by sepsis/acute infection or if another pathology is underlying. CT head showed subdural hematoma of unclear chronicity but suspect subacute to chronic.  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 Follows with Dr Kingsley. Has achieved a CR with chemotherapy, treatment has since been discontinued. Concern for longstanding chemo-induced diarrhea which has been intermittent, acutely worsened, but largely ongoing since his diagnosis. Infectious panel negative.   #T2DM Glucose Elevated on Admission. No home regimen. A1c 8.8%. Will cover with sliding scale while admitted and optimize regimen prior to discharge. - SSI during admission    #Paroxysmal Atrial Fibrillation Follows with [facility]. New diagnosis after he went into A-fib during left heart catheterization 03/2024.  In A-fib on admission, asymptomatic and rate controlled on 25 mg metoprolol  daily.  - Eliquis 2.5 mg BID, on hold as above - Continue metoprolol  for heart rate control - No need for ongoing telemetry monitoring at this time   #  Intermittent PVCs/nonsustained VT Noted on monitor during his hospitalization. Given  acute recent fall, continuous telemetry during admission and consider repeat   - Continue metoprolol  as above   Chronic Problems #Mild aortic insufficiency Noted on echocardiography 03/2024, normal LVEF.  No specific therapies indicated. #Nonobstructive Coronary Artery Disease: formulary equivalent of home statin #Hyperlipidemia Nonobstructive coronary artery disease with 60-70% right-sided PDA lesion, no intervention needed.  - Continue rosuvastatin for cholesterol management. #Essential Tremor: continue primidone  150mg  TID   Checklist: Diet: Regular Diet DVT PPx: Contraindicated - High Risk for Bleeding/Active Bleeding Code Status: Full Code  Dispo: Patient appropriate for Inpatient based on expectation of ongoing need for hospitalization greater than two midnights based on severity of presentation/services including IV antibiotics iso urosepsis   I personally spent 55 minutes face-to-face and non-face-to-face in the care of this patient, which includes all pre, intra, and post visit time on the date of service.  All documented time was specific to the E/M visit and does not include any procedures that may have been performed. ___________________________________________________________________  Subjective: This morning the patient is awake, alert, cooperative, and conversant. He says he is feeling entirely better. His daughter states he is still a bit weak but agrees that his confusion has resolved and he seems much better and is eating.  Labs/Studies: Labs and Studies from the last 24hrs per EMR and Reviewed  Wound 10/19/24 Pressure Injury Sacrum Mid Red and unblanchable with light yellow sloth (Active)  Dressing Status      Clean;Dry;Intact/not removed 10/20/24 0454  Peri-wound Assessment      Clean;Dry 10/20/24 0454  Dressing Silicone foam bordered dressing 10/20/24 0454      Objective: Temp:  [36.4 C (97.5 F)-37.3 C (99.1 F)] 37.3 C (99.1 F) Pulse:  [81-102] 81 Resp:   [16-18] 16 BP: (116-144)/(49-66) 121/53 SpO2:  [93 %-100 %] 99 %, BP 121/53   Pulse 81   Temp 37.3 C (99.1 F) (Axillary)   Resp 16   Ht 182.9 cm (6')   Wt 79.2 kg (174 lb 9.6 oz)   SpO2 99%   BMI 23.68 kg/m ,  Intake/Output Summary (Last 24 hours) at 10/20/2024 0944 Last data filed at 10/20/2024 9376 Gross per 24 hour  Intake 480 ml  Output 3075 ml  Net -2595 ml  , Body mass index is 23.68 kg/m.,  Wt Readings from Last 3 Encounters:  10/20/24 79.2 kg (174 lb 9.6 oz)  08/11/24 83.9 kg (185 lb)  08/09/24 82.9 kg (182 lb 11.2 oz)  ,   Date/Time Pulse BP MAP (mmHg) Arterial Line BP   10/20/24 0828 81  121/53  74  --   10/20/24 0706 85  --  --  --    Date/Time Arterial Line MAP Arterial Line BP 2 Arterial Line MAP 2 Patient Position   10/20/24 0828 -- -- -- Lying    10/20/24 0706 -- -- -- --    General: Well appearing, alert, conversant, cooperative, and in no distress. Eyes: No scleral icterus. Appropriate eye contact. ENT: Normal appearing nose. Oropharyngeal mucus membranes moist & pink. Cardiovascular: Normal rate, irregular rhythm. No murmur.  Extremities: Warm to touch. No LE edema. Chest: Normal respiratory effort on room air. Clear to auscultation bilaterally. Abdomen: Soft, non-tender, non-distended with normoactive bowel sounds. GU: Foley in place draining light yellow clear urine with small amount of white sediment Neurologic: Alert and fully oriented. Normal speech and language. Moving all extremities purposefully. Hearing impaired -- uses hearing aide. Skin: Skin is  warm, dry and intact. No rash. Psychiatric: Calm, cooperative, conversant. Appropriate affect and behavior.

## 2024-10-20 NOTE — Consults (Signed)
 Additional order received, the patient is already on the caseload. Will continue with current plan of care.  Please call Therapy Office at [phone] for scheduling or discharge recommendation questions or by [facility] OT/PT ONCALL PAGER @ [phone].  Thank you!

## 2024-11-25 ENCOUNTER — Ambulatory Visit: Admitting: Podiatry

## 2024-12-28 ENCOUNTER — Ambulatory Visit: Admitting: Podiatry
